# Patient Record
Sex: Female | Born: 1985 | Race: Black or African American | Hispanic: No | Smoking: Never smoker
Health system: Southern US, Community
[De-identification: ages and names within clinical notes are randomized; demographics above are authoritative.]

## PROBLEM LIST (undated history)

## (undated) DIAGNOSIS — D649 Anemia, unspecified: Secondary | ICD-10-CM

## (undated) DIAGNOSIS — I10 Essential (primary) hypertension: Secondary | ICD-10-CM

## (undated) HISTORY — PX: ABDOMINAL HYSTERECTOMY: SHX81

## (undated) HISTORY — PX: DIAGNOSTIC LAPAROSCOPY: SUR761

## (undated) HISTORY — PX: DILATION AND CURETTAGE OF UTERUS: SHX78

---

## 2013-08-23 ENCOUNTER — Emergency Department: Payer: Self-pay | Admitting: Emergency Medicine

## 2014-02-01 ENCOUNTER — Emergency Department: Payer: Self-pay | Admitting: Emergency Medicine

## 2014-02-01 LAB — URINALYSIS, COMPLETE
Bacteria: NONE SEEN
Bilirubin,UR: NEGATIVE
Blood: NEGATIVE
Glucose,UR: NEGATIVE mg/dL (ref 0–75)
Ketone: NEGATIVE
Leukocyte Esterase: NEGATIVE
NITRITE: NEGATIVE
Ph: 7 (ref 4.5–8.0)
Protein: NEGATIVE
RBC, UR: NONE SEEN /HPF (ref 0–5)
Specific Gravity: 1.023 (ref 1.003–1.030)
Squamous Epithelial: 4
WBC UR: 1 /HPF (ref 0–5)

## 2014-02-02 LAB — BASIC METABOLIC PANEL
Anion Gap: 8 (ref 7–16)
BUN: 8 mg/dL (ref 7–18)
CALCIUM: 8.5 mg/dL (ref 8.5–10.1)
CHLORIDE: 105 mmol/L (ref 98–107)
Co2: 25 mmol/L (ref 21–32)
Creatinine: 0.62 mg/dL (ref 0.60–1.30)
EGFR (African American): >60
EGFR (Non-African Amer.): >60
Glucose: 86 mg/dL (ref 65–99)
Osmolality: 273 (ref 275–301)
POTASSIUM: 3.9 mmol/L (ref 3.5–5.1)
Sodium: 138 mmol/L (ref 136–145)

## 2014-02-02 LAB — CBC
HCT: 36.1 % (ref 35.0–47.0)
HGB: 11.6 g/dL — AB (ref 12.0–16.0)
MCH: 27.3 pg (ref 26.0–34.0)
MCHC: 32.2 g/dL (ref 32.0–36.0)
MCV: 85 fL (ref 80–100)
PLATELETS: 225 10*3/uL (ref 150–440)
RBC: 4.27 10*6/uL (ref 3.80–5.20)
RDW: 13.9 % (ref 11.5–14.5)
WBC: 5.5 10*3/uL (ref 3.6–11.0)

## 2014-02-02 LAB — TROPONIN I: Troponin-I: <0.02 ng/mL

## 2014-02-02 LAB — PREGNANCY, URINE: PREGNANCY TEST, URINE: NEGATIVE m[IU]/mL

## 2014-11-18 DIAGNOSIS — I10 Essential (primary) hypertension: Secondary | ICD-10-CM | POA: Insufficient documentation

## 2014-11-18 DIAGNOSIS — N870 Mild cervical dysplasia: Secondary | ICD-10-CM | POA: Insufficient documentation

## 2015-01-20 DIAGNOSIS — D649 Anemia, unspecified: Secondary | ICD-10-CM | POA: Insufficient documentation

## 2016-01-20 ENCOUNTER — Other Ambulatory Visit: Payer: Self-pay

## 2016-02-14 NOTE — H&P (Signed)
Christy Kennedy is a 30 y.o. female here for LAVH   Pt with fibroids, dyspareunia and pelvic pain and menorrhagia . EMBX : negative    Past Medical History:  has a past medical history of Abnormal cytology (11/2013); Allergic state (2014); Chronic anemia, unspecified (01/20/2015); HPV in female; Hypertension; PCOS (polycystic ovarian syndrome); and Uterine fibroid.  Past Surgical History:  has a past surgical history that includes Removal Ovarian Cyst (2004, 2006); removal of fallopian  tube (Bilateral); Colposcopy (12/01/2013); Dilation and curettage of uterus; and Colposcopy. Family History: family history includes No Known Problems in her father and mother. Social History:  reports that she has never smoked. She has never used smokeless tobacco. She reports that she does not drink alcohol or use illicit drugs. OB/GYN History:  OB History    Gravida Para Term Preterm AB TAB SAB Ectopic Multiple Living   3    3   3   0      Allergies: has No Known Allergies. Medications:  Current Outpatient Prescriptions:  .  amLODIPine (NORVASC) 5 MG tablet, Take 1 tablet (5 mg total) by mouth once daily., Disp: 90 tablet, Rfl: 3 .  ferrous sulfate 325 (65 FE) MG tablet, Take 325 mg by mouth daily with breakfast., Disp: , Rfl:  .  HERBAL DRUGS (COLON HERBAL CLEANSER ORAL), Take by mouth. One po every other day, Disp: , Rfl:  .  levonorgestrel-ethinyl estradiol (SEASONALE) 0.15-30 mg-mcg tablet, Take 1 tablet by mouth once daily., Disp: 84 tablet, Rfl: 3  Review of Systems: General:                                          No fatigue or weight loss Eyes:                                                         No vision changes Ears:                                                          No hearing difficulty Respiratory:                No cough or shortness of breath Pulmonary:                                      No asthma or shortness of breath Cardiovascular:                     No chest pain,  palpitations, dyspnea on exertion Gastrointestinal:                    No abdominal bloating, chronic diarrhea, constipations, masses, pain or hematochezia Genitourinary:                                 No hematuria, dysuria, abnormal vaginal discharge,+ pelvic pain,+ Menometrorrhagia,+ dyspareunia,  Lymphatic:                                       No swollen lymph nodes Musculoskeletal:                   No muscle weakness Neurologic:                                      No extremity weakness, syncope, seizure disorder Psychiatric:                                      No history of depression, delusions or suicidal/homicidal ideation    Exam:      Vitals:   02/15/16  BP: 129/73  Pulse: 73    Body mass index is 24.53 kg/(m^2).  WDWN / black female in NAD   Lungs: CTA  CV : RRR without murmur     Abdomen: soft , no mass, normal active bowel sounds,  non-tender, no rebound tenderness Pelvic: tanner stage 5 ,  External genitalia: vulva /labia no lesions Urethra: no prolapse Vagina: normal physiologic d/c, adequate room for vaginal hyst if need be  Cervix: no lesions, no cervical motion tenderness   Uterus: normal size shape and contour, non-tender Adnexa: no mass,  non-tender    Saline infusion sonohysterography: betadine prep to the cervix followed by placement of the HSG catheter into the endometrial canal . Sterile H2O is injected while performing a transvaginal u/s . Findings:3 fibroids , none encroaching on the endometrial canal .largest 33x 29 mm   Impression:   The primary encounter diagnosis was Menorrhagia with irregular cycle. Diagnoses of Dyspareunia, female, Intramural leiomyoma of uterus, and Pelvic pain in female were also pertinent to this visit.    Plan:  I have spoken with the patient regarding treatment options including expectant management, hormonal options, or surgical intervention. After a full discussion the pt elects to proceed with LAVH(  both fallopian tubes previously removed) risks of the procedure discussed . All questions answered .

## 2016-02-15 ENCOUNTER — Encounter
Admission: RE | Admit: 2016-02-15 | Discharge: 2016-02-15 | Disposition: A | Discharge status: Home / Self Care | Payer: BLUE CROSS/BLUE SHIELD | Source: Ambulatory Visit | Attending: Obstetrics and Gynecology | Admitting: Obstetrics and Gynecology

## 2016-02-15 DIAGNOSIS — I10 Essential (primary) hypertension: Secondary | ICD-10-CM | POA: Diagnosis not present

## 2016-02-15 DIAGNOSIS — N941 Unspecified dyspareunia: Secondary | ICD-10-CM | POA: Diagnosis not present

## 2016-02-15 DIAGNOSIS — Z01812 Encounter for preprocedural laboratory examination: Secondary | ICD-10-CM | POA: Insufficient documentation

## 2016-02-15 DIAGNOSIS — N920 Excessive and frequent menstruation with regular cycle: Secondary | ICD-10-CM | POA: Diagnosis not present

## 2016-02-15 DIAGNOSIS — Z01810 Encounter for preprocedural cardiovascular examination: Secondary | ICD-10-CM | POA: Insufficient documentation

## 2016-02-15 DIAGNOSIS — D649 Anemia, unspecified: Secondary | ICD-10-CM | POA: Insufficient documentation

## 2016-02-15 DIAGNOSIS — R102 Pelvic and perineal pain: Secondary | ICD-10-CM | POA: Diagnosis not present

## 2016-02-15 DIAGNOSIS — D259 Leiomyoma of uterus, unspecified: Secondary | ICD-10-CM | POA: Diagnosis present

## 2016-02-15 HISTORY — DX: Anemia, unspecified: D64.9

## 2016-02-15 HISTORY — DX: Essential (primary) hypertension: I10

## 2016-02-15 LAB — TYPE AND SCREEN
ABO/RH(D): O POS
ANTIBODY SCREEN: NEGATIVE

## 2016-02-15 LAB — CBC
HCT: 38.1 % (ref 35.0–47.0)
Hemoglobin: 12.6 g/dL (ref 12.0–16.0)
MCH: 28.2 pg (ref 26.0–34.0)
MCHC: 33 g/dL (ref 32.0–36.0)
MCV: 85.6 fL (ref 80.0–100.0)
PLATELETS: 229 10*3/uL (ref 150–440)
RBC: 4.45 MIL/uL (ref 3.80–5.20)
RDW: 13.5 % (ref 11.5–14.5)
WBC: 3.5 10*3/uL — AB (ref 3.6–11.0)

## 2016-02-15 LAB — BASIC METABOLIC PANEL
Anion gap: 9 (ref 5–15)
BUN: 8 mg/dL (ref 6–20)
CALCIUM: 9.5 mg/dL (ref 8.9–10.3)
CHLORIDE: 99 mmol/L — AB (ref 101–111)
CO2: 27 mmol/L (ref 22–32)
CREATININE: 0.48 mg/dL (ref 0.44–1.00)
GFR calc non Af Amer: >60 mL/min (ref >60)
Glucose, Bld: 73 mg/dL (ref 65–99)
Potassium: 3.9 mmol/L (ref 3.5–5.1)
SODIUM: 135 mmol/L (ref 135–145)

## 2016-02-15 NOTE — Patient Instructions (Signed)
  Your procedure is scheduled on: February 20, 2016 (Monday) Report to Day Surgery. To find out your arrival time please call [phone] between 1PM - 3PM on February 17, 2016 (Friday).  Remember: Instructions that are not followed completely may result in serious medical risk, up to and including death, or upon the discretion of your surgeon and anesthesiologist your surgery may need to be rescheduled.    __x__ 1. Do not eat food or drink liquids after midnight. No gum chewing or hard candies.     _x___ 2. No Alcohol for 24 hours before or after surgery.  _x___ 3. Do Not Smoke For 24 Hours Prior to Your Surgery.   ____ 4. Bring all medications with you on the day of surgery if instructed.    __x__ 5. Notify your doctor if there is any change in your medical condition     (cold, fever, infections).       Do not wear jewelry, make-up, hairpins, clips or nail polish.  Do not wear lotions, powders, or perfumes. You may wear deodorant.  Do not shave 48 hours prior to surgery. Men may shave face and neck.  Do not bring valuables to the hospital.    [facility] is not responsible for any belongings or valuables.               Contacts, dentures or bridgework may not be worn into surgery.  Leave your suitcase in the car. After surgery it may be brought to your room.  For patients admitted to the hospital, discharge time is determined by your                treatment team.   Patients discharged the day of surgery will not be allowed to drive home.   Please read over the following fact sheets that you were given:   Surgical Site Infection Prevention   __x__ Take these medicines the morning of surgery with A SIP OF WATER:    1. Amlodipine  2.   3.   4.  5.  6.  ____ Fleet Enema (as directed)   _x___ Use CHG Soap as directed  ____ Use inhalers on the day of surgery  ____ Stop metformin 2 days prior to surgery    ____ Take 1/2 of usual insulin dose the night before surgery and none  on the morning of surgery.   _x___ Stop Coumadin/Plavix/aspirin on (N/A)  _x__ Stop Anti-inflammatories on (NO NSAIDS) Tylenol ok to take for pain if needed   ____ Stop supplements until after surgery.    ____ Bring C-Pap to the hospital.

## 2016-02-15 NOTE — H&P (Signed)
Pt pain is greater on the left . Therefore I have counseled the pt of a possible left oophorectomy  If significant ovarian involvement .

## 2016-02-16 NOTE — Pre-Procedure Instructions (Signed)
EKG reviewed by Dr. Marcello Moores and stated "no consult needed."

## 2016-02-20 ENCOUNTER — Encounter: Payer: Self-pay | Admitting: *Deleted

## 2016-02-20 ENCOUNTER — Ambulatory Visit: Payer: BLUE CROSS/BLUE SHIELD | Admitting: *Deleted

## 2016-02-20 ENCOUNTER — Encounter
Admission: RE | Disposition: A | Discharge status: Home / Self Care | Payer: Self-pay | Source: Ambulatory Visit | Attending: Obstetrics and Gynecology

## 2016-02-20 ENCOUNTER — Observation Stay
Admission: RE | Admit: 2016-02-20 | Discharge: 2016-02-21 | Disposition: A | Discharge status: Home / Self Care | Payer: BLUE CROSS/BLUE SHIELD | Source: Ambulatory Visit | Attending: Obstetrics and Gynecology | Admitting: Obstetrics and Gynecology

## 2016-02-20 DIAGNOSIS — Z9889 Other specified postprocedural states: Secondary | ICD-10-CM | POA: Insufficient documentation

## 2016-02-20 DIAGNOSIS — N870 Mild cervical dysplasia: Secondary | ICD-10-CM | POA: Diagnosis not present

## 2016-02-20 DIAGNOSIS — K660 Peritoneal adhesions (postprocedural) (postinfection): Secondary | ICD-10-CM | POA: Insufficient documentation

## 2016-02-20 DIAGNOSIS — D649 Anemia, unspecified: Secondary | ICD-10-CM | POA: Diagnosis not present

## 2016-02-20 DIAGNOSIS — R102 Pelvic and perineal pain: Secondary | ICD-10-CM | POA: Diagnosis present

## 2016-02-20 DIAGNOSIS — N8312 Corpus luteum cyst of left ovary: Secondary | ICD-10-CM | POA: Insufficient documentation

## 2016-02-20 DIAGNOSIS — I10 Essential (primary) hypertension: Secondary | ICD-10-CM | POA: Diagnosis not present

## 2016-02-20 DIAGNOSIS — D259 Leiomyoma of uterus, unspecified: Principal | ICD-10-CM | POA: Insufficient documentation

## 2016-02-20 DIAGNOSIS — N920 Excessive and frequent menstruation with regular cycle: Secondary | ICD-10-CM | POA: Insufficient documentation

## 2016-02-20 DIAGNOSIS — Z79899 Other long term (current) drug therapy: Secondary | ICD-10-CM | POA: Diagnosis not present

## 2016-02-20 DIAGNOSIS — N83202 Unspecified ovarian cyst, left side: Secondary | ICD-10-CM | POA: Diagnosis present

## 2016-02-20 DIAGNOSIS — Z793 Long term (current) use of hormonal contraceptives: Secondary | ICD-10-CM | POA: Insufficient documentation

## 2016-02-20 DIAGNOSIS — E282 Polycystic ovarian syndrome: Secondary | ICD-10-CM | POA: Insufficient documentation

## 2016-02-20 DIAGNOSIS — N9489 Other specified conditions associated with female genital organs and menstrual cycle: Secondary | ICD-10-CM | POA: Insufficient documentation

## 2016-02-20 HISTORY — PX: OOPHORECTOMY: SHX6387

## 2016-02-20 HISTORY — PX: LAPAROSCOPIC ASSISTED VAGINAL HYSTERECTOMY: SHX5398

## 2016-02-20 LAB — POCT PREGNANCY, URINE: PREG TEST UR: NEGATIVE

## 2016-02-20 SURGERY — HYSTERECTOMY, VAGINAL, LAPAROSCOPY-ASSISTED
Anesthesia: General

## 2016-02-20 MED ORDER — SUGAMMADEX SODIUM 200 MG/2ML IV SOLN
INTRAVENOUS | Status: DC | PRN
  Administered 2016-02-20: 300 mg via INTRAVENOUS

## 2016-02-20 MED ORDER — MORPHINE SULFATE (PF) 2 MG/ML IV SOLN
1.0000 mg | INTRAVENOUS | Status: DC | PRN
  Administered 2016-02-20: 2 mg via INTRAVENOUS
  Filled 2016-02-20 (×2): qty 1

## 2016-02-20 MED ORDER — LACTATED RINGERS IV SOLN
INTRAVENOUS | Status: DC | PRN
Start: 2016-02-20 — End: 2016-02-20
  Administered 2016-02-20: 10:00:00 via INTRAVENOUS

## 2016-02-20 MED ORDER — CEFOXITIN SODIUM-DEXTROSE 2-2.2 GM-% IV SOLR (PREMIX)
2.0000 g | INTRAVENOUS | Status: AC
  Administered 2016-02-20: 2 g via INTRAVENOUS

## 2016-02-20 MED ORDER — LIDOCAINE-EPINEPHRINE 1 %-1:100000 IJ SOLN
INTRAMUSCULAR | Status: AC
  Filled 2016-02-20: qty 1

## 2016-02-20 MED ORDER — FENTANYL CITRATE (PF) 100 MCG/2ML IJ SOLN
INTRAMUSCULAR | Status: DC | PRN
  Administered 2016-02-20 (×2): 100 ug via INTRAVENOUS

## 2016-02-20 MED ORDER — FAMOTIDINE 20 MG PO TABS
ORAL_TABLET | ORAL | Status: AC
  Administered 2016-02-20: 20 mg via ORAL
  Filled 2016-02-20: qty 1

## 2016-02-20 MED ORDER — PROPOFOL 10 MG/ML IV BOLUS
INTRAVENOUS | Status: DC | PRN
  Administered 2016-02-20: 150 mg via INTRAVENOUS

## 2016-02-20 MED ORDER — BUPIVACAINE HCL (PF) 0.5 % IJ SOLN
INTRAMUSCULAR | Status: AC
  Filled 2016-02-20: qty 30

## 2016-02-20 MED ORDER — LIDOCAINE-EPINEPHRINE 1 %-1:100000 IJ SOLN
INTRAMUSCULAR | Status: DC | PRN
  Administered 2016-02-20: 6 mL

## 2016-02-20 MED ORDER — ONDANSETRON HCL 4 MG/2ML IJ SOLN
4.0000 mg | Freq: Four times a day (QID) | INTRAMUSCULAR | Status: DC | PRN
  Administered 2016-02-20 (×2): 4 mg via INTRAVENOUS
  Filled 2016-02-20 (×2): qty 2

## 2016-02-20 MED ORDER — CEFOXITIN SODIUM-DEXTROSE 2-2.2 GM-% IV SOLR (PREMIX)
INTRAVENOUS | Status: AC
  Filled 2016-02-20: qty 50

## 2016-02-20 MED ORDER — ONDANSETRON HCL 4 MG/2ML IJ SOLN: 4.0000 mg | Freq: Once | INTRAMUSCULAR | Status: DC | PRN

## 2016-02-20 MED ORDER — LACTATED RINGERS IV SOLN
INTRAVENOUS | Status: DC
  Administered 2016-02-20: 09:00:00 via INTRAVENOUS

## 2016-02-20 MED ORDER — FENTANYL CITRATE (PF) 100 MCG/2ML IJ SOLN
INTRAMUSCULAR | Status: AC
  Administered 2016-02-20: 25 ug via INTRAVENOUS
  Filled 2016-02-20: qty 2

## 2016-02-20 MED ORDER — FENTANYL CITRATE (PF) 100 MCG/2ML IJ SOLN
25.0000 ug | INTRAMUSCULAR | Status: DC | PRN
  Administered 2016-02-20 (×4): 25 ug via INTRAVENOUS

## 2016-02-20 MED ORDER — LACTATED RINGERS IV SOLN
INTRAVENOUS | Status: DC
  Administered 2016-02-20 – 2016-02-21 (×2): via INTRAVENOUS

## 2016-02-20 MED ORDER — ONDANSETRON HCL 4 MG PO TABS: 4.0000 mg | ORAL_TABLET | Freq: Four times a day (QID) | ORAL | Status: DC | PRN

## 2016-02-20 MED ORDER — FAMOTIDINE 20 MG PO TABS
20.0000 mg | ORAL_TABLET | Freq: Once | ORAL | Status: AC
  Administered 2016-02-20: 20 mg via ORAL

## 2016-02-20 MED ORDER — SOD CITRATE-CITRIC ACID 500-334 MG/5ML PO SOLN: 30.0000 mL | ORAL | Status: DC

## 2016-02-20 MED ORDER — DEXAMETHASONE SODIUM PHOSPHATE 10 MG/ML IJ SOLN
INTRAMUSCULAR | Status: DC | PRN
  Administered 2016-02-20: 5 mg via INTRAVENOUS

## 2016-02-20 MED ORDER — ESMOLOL HCL 100 MG/10ML IV SOLN
INTRAVENOUS | Status: DC | PRN
Start: 2016-02-20 — End: 2016-02-20
  Administered 2016-02-20: 10 mg via INTRAVENOUS
  Administered 2016-02-20: 20 mg via INTRAVENOUS

## 2016-02-20 MED ORDER — IBUPROFEN 800 MG PO TABS
800.0000 mg | ORAL_TABLET | Freq: Three times a day (TID) | ORAL | Status: DC | PRN
  Administered 2016-02-21: 800 mg via ORAL
  Filled 2016-02-20: qty 1

## 2016-02-20 MED ORDER — BUPIVACAINE HCL 0.5 % IJ SOLN
INTRAMUSCULAR | Status: DC | PRN
  Administered 2016-02-20: 9 mL

## 2016-02-20 MED ORDER — HYDROCODONE-ACETAMINOPHEN 5-325 MG PO TABS
1.0000 | ORAL_TABLET | ORAL | Status: DC | PRN
  Administered 2016-02-20: 2 via ORAL
  Administered 2016-02-20 – 2016-02-21 (×5): 1 via ORAL
  Filled 2016-02-20: qty 1
  Filled 2016-02-20: qty 2
  Filled 2016-02-20 (×4): qty 1

## 2016-02-20 MED ORDER — MIDAZOLAM HCL 2 MG/2ML IJ SOLN
INTRAMUSCULAR | Status: DC | PRN
  Administered 2016-02-20: 2 mg via INTRAVENOUS

## 2016-02-20 MED ORDER — ROCURONIUM BROMIDE 100 MG/10ML IV SOLN
INTRAVENOUS | Status: DC | PRN
  Administered 2016-02-20: 40 mg via INTRAVENOUS
  Administered 2016-02-20 (×3): 10 mg via INTRAVENOUS

## 2016-02-20 MED ORDER — ONDANSETRON HCL 4 MG/2ML IJ SOLN
INTRAMUSCULAR | Status: DC | PRN
  Administered 2016-02-20: 4 mg via INTRAVENOUS

## 2016-02-20 SURGICAL SUPPLY — 44 items
BAG URO DRAIN 2000ML W/SPOUT (MISCELLANEOUS) ×4 IMPLANT
BLADE SURG SZ11 CARB STEEL (BLADE) ×4 IMPLANT
CATH FOLEY 2WAY  5CC 16FR (CATHETERS) ×2
CATH URTH 16FR FL 2W BLN LF (CATHETERS) ×2 IMPLANT
CHLORAPREP W/TINT 26ML (MISCELLANEOUS) ×4 IMPLANT
CLOSURE WOUND 1/2 X4 (GAUZE/BANDAGES/DRESSINGS)
DRAPE SURG 17X11 SM STRL (DRAPES) ×4 IMPLANT
DRSG TEGADERM 2X2.25 PEDS (GAUZE/BANDAGES/DRESSINGS) IMPLANT
ELECT REM PT RETURN 9FT ADLT (ELECTROSURGICAL) ×4
ELECTRODE REM PT RTRN 9FT ADLT (ELECTROSURGICAL) ×2 IMPLANT
FILTER LAP SMOKE EVAC STRL (MISCELLANEOUS) IMPLANT
GLOVE BIO SURGEON STRL SZ8 (GLOVE) ×16 IMPLANT
GOWN STRL REUS W/ TWL LRG LVL3 (GOWN DISPOSABLE) ×6 IMPLANT
GOWN STRL REUS W/ TWL XL LVL3 (GOWN DISPOSABLE) ×6 IMPLANT
GOWN STRL REUS W/TWL LRG LVL3 (GOWN DISPOSABLE) ×6
GOWN STRL REUS W/TWL XL LVL3 (GOWN DISPOSABLE) ×6
HANDLE YANKAUER SUCT BULB TIP (MISCELLANEOUS) ×4 IMPLANT
IRRIGATION STRYKERFLOW (MISCELLANEOUS) ×2 IMPLANT
IRRIGATOR STRYKERFLOW (MISCELLANEOUS) ×4
KIT PINK PAD W/HEAD ARE REST (MISCELLANEOUS) ×4
KIT PINK PAD W/HEAD ARM REST (MISCELLANEOUS) ×2 IMPLANT
KIT RM TURNOVER CYSTO AR (KITS) ×4 IMPLANT
LABEL OR SOLS (LABEL) IMPLANT
LIQUID BAND (GAUZE/BANDAGES/DRESSINGS) ×4 IMPLANT
PACK BASIN MINOR ARMC (MISCELLANEOUS) IMPLANT
PACK GYN LAPAROSCOPIC (MISCELLANEOUS) ×4 IMPLANT
PAD OB MATERNITY 4.3X12.25 (PERSONAL CARE ITEMS) ×4 IMPLANT
SCISSORS METZENBAUM CVD 33 (INSTRUMENTS) IMPLANT
SHEARS HARMONIC ACE PLUS 36CM (ENDOMECHANICALS) ×4 IMPLANT
SLEEVE ENDOPATH XCEL 5M (ENDOMECHANICALS) ×4 IMPLANT
SPONGE XRAY 4X4 16PLY STRL (MISCELLANEOUS) IMPLANT
STRIP CLOSURE SKIN 1/2X4 (GAUZE/BANDAGES/DRESSINGS) IMPLANT
SUT VIC AB 0 CT1 27 (SUTURE) ×4
SUT VIC AB 0 CT1 27XCR 8 STRN (SUTURE) ×4 IMPLANT
SUT VIC AB 0 CT1 36 (SUTURE) ×4 IMPLANT
SUT VIC AB 0 CT2 27 (SUTURE) ×4 IMPLANT
SUT VIC AB 2-0 SH 27 (SUTURE) ×2
SUT VIC AB 2-0 SH 27XBRD (SUTURE) ×2 IMPLANT
SUT VIC AB 2-0 UR6 27 (SUTURE) ×4 IMPLANT
SUT VIC AB 4-0 FS2 27 (SUTURE) ×4 IMPLANT
SYRINGE 10CC LL (SYRINGE) ×4 IMPLANT
TROCAR ENDO BLADELESS 11MM (ENDOMECHANICALS) ×4 IMPLANT
TROCAR XCEL NON-BLD 5MMX100MML (ENDOMECHANICALS) ×4 IMPLANT
TUBING INSUFFLATOR HEATED (MISCELLANEOUS) ×4 IMPLANT

## 2016-02-20 NOTE — Anesthesia Preprocedure Evaluation (Signed)
Anesthesia Evaluation  Patient identified by MRN, date of birth, ID band Patient awake    Reviewed: Allergy & Precautions, NPO status , Patient's Chart, lab work & pertinent test results  History of Anesthesia Complications Negative for: history of anesthetic complications  Airway Mallampati: II       Dental   Pulmonary neg pulmonary ROS,           Cardiovascular hypertension, Pt. on medications      Neuro/Psych negative neurological ROS     GI/Hepatic negative GI ROS, Neg liver ROS,   Endo/Other  negative endocrine ROS  Renal/GU negative Renal ROS     Musculoskeletal   Abdominal   Peds  Hematology  (+) anemia ,   Anesthesia Other Findings   Reproductive/Obstetrics                             Anesthesia Physical Anesthesia Plan  ASA: II  Anesthesia Plan: General   Post-op Pain Management:    Induction: Intravenous  Airway Management Planned:   Additional Equipment:   Intra-op Plan:   Post-operative Plan:   Informed Consent: I have reviewed the patients History and Physical, chart, labs and discussed the procedure including the risks, benefits and alternatives for the proposed anesthesia with the patient or authorized representative who has indicated his/her understanding and acceptance.     Plan Discussed with:   Anesthesia Plan Comments:         Anesthesia Quick Evaluation

## 2016-02-20 NOTE — Anesthesia Procedure Notes (Signed)
Date/Time: 02/20/2016 9:53 AM Performed by: Jennette Bill Pre-anesthesia Checklist: Patient identified, Emergency Drugs available, Suction available, Patient being monitored and Timeout performed Patient Re-evaluated:Patient Re-evaluated prior to inductionOxygen Delivery Method: Circle system utilized Preoxygenation: Pre-oxygenation with 100% oxygen Intubation Type: IV induction Laryngoscope Size: Mac and 3 Grade View: Grade II Tube type: Oral Tube size: 7.0 mm Number of attempts: 1 Placement Confirmation: ETT inserted through vocal cords under direct vision,  positive ETCO2 and breath sounds checked- equal and bilateral Secured at: 22 cm Dental Injury: Teeth and Oropharynx as per pre-operative assessment

## 2016-02-20 NOTE — Op Note (Deleted)
NAME:  Christy Kennedy, Christy Kennedy NO.:  [id number]  MEDICAL RECORD NO.:  [id number]  LOCATION:                               FACILITY:  [facility]  PHYSICIAN:  Laverta Baltimore, MDDATE OF BIRTH:  06/03/1986  DATE OF PROCEDURE: DATE OF DISCHARGE:  02/21/2016                              OPERATIVE REPORT   PREOPERATIVE DIAGNOSIS: 1. Menorrhagia. 2. Fibroid uterus. 3. Pelvic pain, left greater than right.  POSTOPERATIVE DIAGNOSIS: 1. Menorrhagia. 2. Fibroid uterus. 3. Complex left ovary. 4. Pelvic adhesions.  Procedure:  1. Laparoscopic assisted vaginal hysterectomy  2. Left oophorectomy  3. Lysis of adhesions  4. Peritoneal biopsy  ANESTHESIA:  General endotracheal anesthesia.  SURGEON:  Laverta Baltimore, MD  FIRST ASSISTANT:  Leafy Ro.  INDICATIONS:  A 30 year old, gravida 3, para 0 patient with several-year history of menorrhagia, known fibroid uterus, dyspareunia, left-greater- than-right pelvic pain.  The patient is status post 2 prior surgeries with removal of both fallopian tubes.  DESCRIPTION OF PROCEDURE:  After adequate general endotracheal anesthesia, the patient was placed in dorsal supine position, legs in the [place] stirrups.  Abdomen, perineum, and vagina were prepped and draped in normal sterile fashion.  The patient did receive 2 g IV cefoxitin prior to commencement of the case.  Time-out was performed.  A Foley catheter was placed into the bladder yielding 150 mL clear urine. A speculum was placed in the vagina and the anterior cervix was grasped with a single-tooth tenaculum.  Gloves were changed and attention was directed to the abdomen.  An infraumbilical incision was made after injecting with 0.5% Marcaine.  The laparoscope was advanced into the abdominal cavity under direct visualization with the Optiview cannula. The patient's abdomen was insufflated.  The patient was placed in Trendelenburg.  At the left lower port site, 5 mm port was  advanced 3 cm medial to the left anterior iliac spine.  Under direct visualization, a 5 mm trocar was advanced into the abdominal cavity.  Similar procedure was repeated on the right, again 3 cm medial to the right anterior iliac spine, a 5 mm port advanced into the abdominal cavity.  Attention was then directed to the patient's pelvis which revealed a 5 x 3 cm solid- appearing left complex ovarian mass.  The posterior cul-de-sac had demonstrated multiple small vesicles possibly consistent with endometriosis.  The upper abdomen showed flimsy omental adhesions to the anterior abdominal wall.  The Harmonic Scalpel was then brought up to the operative field, and given the size of the left ovary and the complexity, it was decided that the patient will undergo a left oophorectomy.  After visualizing the ureters bilaterally, the left infundibulopelvic ligament was cauterized with the Kleppinger and then dissected free with the Harmonic Scalpel.  Broad ligament was opened and ultimately the bladder flap was created with the Harmonic Scalpel.  The left uterine artery was skeletonized, bovied, and then transected with the Harmonic Scalpel.  Good hemostasis noted.  On the right side, the right round ligament was opened followed by clamping and opening the right uterine ovarian ligament.  Serial bites were taken in the right broad ligaments and bladder flap was communicated from the left to  the right uterine artery.  The artery was skeletonized, bovied, and transected.  Good hemostasis was noted.  The adhesions to the anterior abdominal wall were then taken down with the Harmonic Scalpel under direct visualization.  Given the posterior cul-de-sac anterior to the right uterosacral ligament had multiple vesicles consistent with possible endometriosis.  This tissue was grasped and dissected free and sent to Pathology for permanent identification.  Surgeon then moved to the vaginal area, legs were  brought up, and a weighted speculum was placed in the vagina and the anterior and posterior cervix was grasped with 2 thyroid tenacula.  The cervix was circumferentially injected without difficulty.  A direct posterior colpotomy incision was made upon entry into the posterior cul-de-sac.  Some old serosanguineous fluid was released.  The uterosacral ligaments were then bilaterally clamped, transected, suture ligated, and identified and held for later identification.  Anterior cervix was circumferentially incised with the Bovie.  Cardinal ligaments were bilaterally clamped, transected, suture ligated with 0 Vicryl suture.  The uterine arteries were also clamped, transected, suture ligated with 0 Vicryl suture.  The anterior cul-de- sac was then entered without difficulty and ultimately the uterus and right ovary were removed vaginally.  The pedicles appeared hemostatic and the vaginal cuff was then closed with a 0 Vicryl suture in a running, nonlocking fashion.  Good approximation of edges, good hemostasis was noted.  Gloves were changed and attention was then directed towards the abdominal cavity where the laparoscope was advanced again into the abdominal cavity and tissues appeared hemostatic. Ureters appeared normal bilaterally.  The patient's abdomen was then deflated and the infraumbilical incision was closed with a fascial layer of 2-0 Vicryl suture and all 3 skin incisions were closed with interrupted 4-0 Vicryl suture.  A 2 x 2 dressing and Tegaderm placed on the 3 port sites.  There were no complications.  ESTIMATED BLOOD LOSS:  50 mL.  URINE OUTPUT:  1000 mL.  INTRAOPERATIVE FLUIDS:  1200 mL.  The patient was taken to recovery room in good condition.          ______________________________ Laverta Baltimore, MD     TS/MEDQ  D:  02/20/2016  T:  02/20/2016  Job:  [id number]

## 2016-02-20 NOTE — Brief Op Note (Signed)
02/20/2016  11:54 AM  PATIENT:  Christy Kennedy  30 y.o. female  PRE-OPERATIVE DIAGNOSIS:  Menorrhagia, dyspareunia, pelvic pain, fibroids  POST-OPERATIVE DIAGNOSIS:  Menorrhagia, dyspareunia, pelvic pain, fibroids, complex 5cm left ovarian cyst . Abdominal adhesions  PROCEDURE:  Procedure(s): LAPAROSCOPIC ASSISTED VAGINAL HYSTERECTOMY (N/A) OOPHORECTOMY (Left), lysis of adhesions , peritoneal biopsy   SURGEON:  Surgeon(s) and Role:    Boykin Nearing, MD - Primary    * Benjaman Kindler, MD - Assisting  PHYSICIAN ASSISTANT:   ASSISTANTS:  doman, scrub tech   ANESTHESIA:   general  EBL:  Total I/O In: -  Out: [id number] [Urine:1000; Blood:50]  IOF 1200 cc  BLOOD ADMINISTERED:none  DRAINS: Urinary Catheter (Foley)   LOCAL MEDICATIONS USED:  MARCAINE   , 7 cc  SPECIMEN:  Source of Specimen:  cervix , uterus , left ovarian , peritoneal biopsy  DISPOSITION OF SPECIMEN:  PATHOLOGY  COUNTS:  YES  TOURNIQUET:  * No tourniquets in log *  DICTATION: .Other Dictation: Dictation Number verbal   PLAN OF CARE: Admit for overnight observation  PATIENT DISPOSITION:  PACU - hemodynamically stable.   Delay start of Pharmacological VTE agent (>24hrs) due to surgical blood loss or risk of bleeding: not applicable

## 2016-02-20 NOTE — Progress Notes (Signed)
Patient ID: Christy Kennedy, female   DOB: 1985-12-31, 31 y.o.   MRN: [id number] DOS LAVH + left Oophorectomy  Pain ok  O: VSS  UO good  Perineum : no blood  A: stable  P: Anticipate d/c in am

## 2016-02-20 NOTE — Anesthesia Postprocedure Evaluation (Signed)
Anesthesia Post Note  Patient: Christy Kennedy  Procedure(s) Performed: Procedure(s) (LRB): LAPAROSCOPIC ASSISTED VAGINAL HYSTERECTOMY, LEFT OOPHERECTOMY, LYSIS OF ADHESIONS (N/A) OOPHORECTOMY (Left)  Patient location during evaluation: PACU Anesthesia Type: General Level of consciousness: awake and alert Pain management: pain level controlled Vital Signs Assessment: post-procedure vital signs reviewed and stable Respiratory status: spontaneous breathing and respiratory function stable Cardiovascular status: stable Anesthetic complications: no    Last Vitals:  Filed Vitals:   02/20/16 1315 02/20/16 1418  BP: 112/60 112/69  Pulse: 73 69  Temp: 36.3 C 36.4 C  Resp: 20 20    Last Pain:  Filed Vitals:   02/20/16 1453  PainSc: 6                  Maryrose Colvin K

## 2016-02-20 NOTE — Transfer of Care (Signed)
Immediate Anesthesia Transfer of Care Note  Patient: Christy Kennedy  Procedure(s) Performed: Procedure(s): LAPAROSCOPIC ASSISTED VAGINAL HYSTERECTOMY (N/A) OOPHORECTOMY (Left)  Patient Location: PACU  Anesthesia Type:General  Level of Consciousness: awake, alert  and oriented  Airway & Oxygen Therapy: Patient Spontanous Breathing and Patient connected to face mask oxygen  Post-op Assessment: Report given to RN and Post -op Vital signs reviewed and stable  Post vital signs: Reviewed and stable  Last Vitals:  Filed Vitals:   02/20/16 0849  BP: 155/86  Pulse: 74  Temp: 37.1 C  Resp: 16    Last Pain: There were no vitals filed for this visit.       Complications: No apparent anesthesia complications

## 2016-02-20 NOTE — Op Note (Signed)
NAME:  YOANA, LARACUENTE NO.:  [id number]  MEDICAL RECORD NO.:  [id number]  LOCATION:                               FACILITY:  [facility]  PHYSICIAN:  Laverta Baltimore, MDDATE OF BIRTH:  1986/04/30  DATE OF PROCEDURE: DATE OF DISCHARGE:  02/21/2016                              OPERATIVE REPORT   PREOPERATIVE DIAGNOSIS: 1. Menorrhagia. 2. Fibroid uterus. 3. Pelvic pain, left greater than right.  POSTOPERATIVE DIAGNOSIS: 1. Menorrhagia. 2. Fibroid uterus. 3. Complex left ovary. 4. Pelvic adhesions.  Procedure:  1. Laparoscopic assisted vaginal hysterectomy  2. Left oophorectomy  3. Lysis of adhesions  4. Peritoneal biopsy  ANESTHESIA:  General endotracheal anesthesia.  SURGEON:  Laverta Baltimore, MD  FIRST ASSISTANT:  Leafy Ro.  INDICATIONS:  A 30 year old, gravida 3, para 0 patient with several-year history of menorrhagia, known fibroid uterus, dyspareunia, left-greater- than-right pelvic pain.  The patient is status post 2 prior surgeries with removal of both fallopian tubes.  DESCRIPTION OF PROCEDURE:  After adequate general endotracheal anesthesia, the patient was placed in dorsal supine position, legs in the [place] stirrups.  Abdomen, perineum, and vagina were prepped and draped in normal sterile fashion.  The patient did receive 2 g IV cefoxitin prior to commencement of the case.  Time-out was performed.  A Foley catheter was placed into the bladder yielding 150 mL clear urine. A speculum was placed in the vagina and the anterior cervix was grasped with a single-tooth tenaculum.  Gloves were changed and attention was directed to the abdomen.  An infraumbilical incision was made after injecting with 0.5% Marcaine.  The laparoscope was advanced into the abdominal cavity under direct visualization with the Optiview cannula. The patient's abdomen was insufflated.  The patient was placed in Trendelenburg.  At the left lower port site, 5 mm port was  advanced 3 cm medial to the left anterior iliac spine.  Under direct visualization, a 5 mm trocar was advanced into the abdominal cavity.  Similar procedure was repeated on the right, again 3 cm medial to the right anterior iliac spine, a 5 mm port advanced into the abdominal cavity.  Attention was then directed to the patient's pelvis which revealed a 5 x 3 cm solid- appearing left complex ovarian mass.  The posterior cul-de-sac had demonstrated multiple small vesicles possibly consistent with endometriosis.  The upper abdomen showed flimsy omental adhesions to the anterior abdominal wall.  The Harmonic Scalpel was then brought up to the operative field, and given the size of the left ovary and the complexity, it was decided that the patient will undergo a left oophorectomy.  After visualizing the ureters bilaterally, the left infundibulopelvic ligament was cauterized with the Kleppinger and then dissected free with the Harmonic Scalpel.  Broad ligament was opened and ultimately the bladder flap was created with the Harmonic Scalpel.  The left uterine artery was skeletonized, bovied, and then transected with the Harmonic Scalpel.  Good hemostasis noted.  On the right side, the right round ligament was opened followed by clamping and opening the right uterine ovarian ligament.  Serial bites were taken in the right broad ligaments and bladder flap was communicated from the left to  the right uterine artery.  The artery was skeletonized, bovied, and transected.  Good hemostasis was noted.  The adhesions to the anterior abdominal wall were then taken down with the Harmonic Scalpel under direct visualization.  Given the posterior cul-de-sac anterior to the right uterosacral ligament had multiple vesicles consistent with possible endometriosis.  This tissue was grasped and dissected free and sent to Pathology for permanent identification.  Surgeon then moved to the vaginal area, legs were  brought up, and a weighted speculum was placed in the vagina and the anterior and posterior cervix was grasped with 2 thyroid tenacula.  The cervix was circumferentially injected without difficulty.  A direct posterior colpotomy incision was made upon entry into the posterior cul-de-sac.  Some old serosanguineous fluid was released.  The uterosacral ligaments were then bilaterally clamped, transected, suture ligated, and identified and held for later identification.  Anterior cervix was circumferentially incised with the Bovie.  Cardinal ligaments were bilaterally clamped, transected, suture ligated with 0 Vicryl suture.  The uterine arteries were also clamped, transected, suture ligated with 0 Vicryl suture.  The anterior cul-de- sac was then entered without difficulty and ultimately the uterus and right ovary were removed vaginally.  The pedicles appeared hemostatic and the vaginal cuff was then closed with a 0 Vicryl suture in a running, nonlocking fashion.  Good approximation of edges, good hemostasis was noted.  Gloves were changed and attention was then directed towards the abdominal cavity where the laparoscope was advanced again into the abdominal cavity and tissues appeared hemostatic. Ureters appeared normal bilaterally.  The patient's abdomen was then deflated and the infraumbilical incision was closed with a fascial layer of 2-0 Vicryl suture and all 3 skin incisions were closed with interrupted 4-0 Vicryl suture.  A 2 x 2 dressing and Tegaderm placed on the 3 port sites.  There were no complications.  ESTIMATED BLOOD LOSS:  50 mL.  URINE OUTPUT:  1000 mL.  INTRAOPERATIVE FLUIDS:  1200 mL.  The patient was taken to recovery room in good condition.          ______________________________ Laverta Baltimore, MD     TS/MEDQ  D:  02/20/2016  T:  02/20/2016  Job:  [id number]

## 2016-02-20 NOTE — Progress Notes (Signed)
Ready for LAVH  Possible Left salpingectomy. LAbs reviewed . Hcg : neg .  All questions answered . NPO . Proceed with surgery

## 2016-02-21 ENCOUNTER — Encounter: Payer: Self-pay | Admitting: Obstetrics and Gynecology

## 2016-02-21 DIAGNOSIS — D259 Leiomyoma of uterus, unspecified: Secondary | ICD-10-CM | POA: Diagnosis not present

## 2016-02-21 LAB — CBC
HEMATOCRIT: 35.6 % (ref 35.0–47.0)
HEMOGLOBIN: 11.8 g/dL — AB (ref 12.0–16.0)
MCH: 28.3 pg (ref 26.0–34.0)
MCHC: 33.1 g/dL (ref 32.0–36.0)
MCV: 85.5 fL (ref 80.0–100.0)
Platelets: 218 10*3/uL (ref 150–440)
RBC: 4.16 MIL/uL (ref 3.80–5.20)
RDW: 12.9 % (ref 11.5–14.5)
WBC: 10 10*3/uL (ref 3.6–11.0)

## 2016-02-21 LAB — BASIC METABOLIC PANEL
ANION GAP: 7 (ref 5–15)
BUN: 6 mg/dL (ref 6–20)
CALCIUM: 8.8 mg/dL — AB (ref 8.9–10.3)
CHLORIDE: 104 mmol/L (ref 101–111)
CO2: 28 mmol/L (ref 22–32)
Creatinine, Ser: 0.54 mg/dL (ref 0.44–1.00)
Glucose, Bld: 97 mg/dL (ref 65–99)
POTASSIUM: 3.6 mmol/L (ref 3.5–5.1)
Sodium: 139 mmol/L (ref 135–145)

## 2016-02-21 MED ORDER — HYDROCODONE-ACETAMINOPHEN 5-325 MG PO TABS: 1.0000 | ORAL_TABLET | ORAL | Status: DC | PRN

## 2016-02-21 MED ORDER — DOCUSATE SODIUM 100 MG PO CAPS: 100.0000 mg | ORAL_CAPSULE | Freq: Two times a day (BID) | ORAL | Status: DC

## 2016-02-21 MED ORDER — IBUPROFEN 800 MG PO TABS: 800.0000 mg | ORAL_TABLET | Freq: Three times a day (TID) | ORAL | Status: DC | PRN

## 2016-02-21 MED ORDER — ONDANSETRON HCL 4 MG PO TABS: 4.0000 mg | ORAL_TABLET | Freq: Four times a day (QID) | ORAL | Status: DC | PRN

## 2016-02-21 NOTE — Progress Notes (Signed)
Discharge instructions reviewed with pt.  Rx given for home use.

## 2016-02-21 NOTE — Progress Notes (Signed)
Discharged to home to car via wheelchair

## 2016-02-21 NOTE — Discharge Summary (Signed)
Physician Discharge Summary  Patient ID: Christy Kennedy MRN: [id number] DOB/AGE: [birth year] 30 y.o.  Admit date: 02/20/2016 Discharge date: 02/21/2016  Admission Diagnoses:menorrhagia, pelvic pain , left ovarian cyst  Discharge Diagnoses: same Active Problems:   Post-operative state   Discharged Condition: good  Hospital Course: uncomplicated LAVH and left oophorectomy , LOA  And peritoneal biopsy   Consults: None  Significant Diagnostic Studies: labs: hct 35 , BUN/ CR nl   Treatments: surgery: as above  Discharge Exam: Blood pressure 131/74, pulse 77, temperature 98.7 F (37.1 C), temperature source Oral, resp. rate 18, height 5' 5.5" (1.664 m), weight 154 lb (69.854 kg), SpO2 100 %. General appearance: alert and cooperative Resp: clear to auscultation bilaterally Cardio: regular rate and rhythm, S1, S2 normal, no murmur, click, rub or gallop GI: soft, non-tender; bowel sounds normal; no masses,  no organomegaly Incisions dry  Disposition: Final discharge disposition not confirmed  Discharge Instructions    Call MD for:  difficulty breathing, headache or visual disturbances    Complete by:  As directed      Call MD for:  extreme fatigue    Complete by:  As directed      Call MD for:  hives    Complete by:  As directed      Call MD for:  persistant dizziness or light-headedness    Complete by:  As directed      Call MD for:  persistant nausea and vomiting    Complete by:  As directed      Call MD for:  redness, tenderness, or signs of infection (pain, swelling, redness, odor or green/yellow discharge around incision site)    Complete by:  As directed      Call MD for:  severe uncontrolled pain    Complete by:  As directed      Call MD for:  temperature >100.4    Complete by:  As directed      Diet - low sodium heart healthy    Complete by:  As directed      Increase activity slowly    Complete by:  As directed      Sexual Activity Restrictions    Complete by:  As  directed   Pelvic rest 30 days            Medication List    TAKE these medications        amLODipine 5 MG tablet  Commonly known as:  NORVASC  Take 5 mg by mouth daily.     bisacodyl 5 MG EC tablet  Commonly known as:  DULCOLAX  Take 5 mg by mouth daily as needed for moderate constipation.     docusate sodium 100 MG capsule  Commonly known as:  COLACE  Take 1 capsule (100 mg total) by mouth 2 (two) times daily.     ferrous sulfate 325 (65 FE) MG tablet  Take 325 mg by mouth every other day.     HYDROcodone-acetaminophen 5-325 MG tablet  Commonly known as:  NORCO/VICODIN  Take 1-2 tablets by mouth every 4 (four) hours as needed for moderate pain.     ibuprofen 800 MG tablet  Commonly known as:  ADVIL,MOTRIN  Take 1 tablet (800 mg total) by mouth every 8 (eight) hours as needed (mild pain).     ondansetron 4 MG tablet  Commonly known as:  ZOFRAN  Take 1 tablet (4 mg total) by mouth every 6 (six) hours as needed for nausea.  Follow-up Information    Follow up with Maika Mcelveen, MD In 2 weeks.   Specialty:  Obstetrics and Gynecology   Contact information:   [address] [place] [address] [phone]       Signed: Laverta Baltimore 02/21/2016, 9:16 AM

## 2016-02-22 LAB — SURGICAL PATHOLOGY

## 2016-05-30 DIAGNOSIS — R3121 Asymptomatic microscopic hematuria: Secondary | ICD-10-CM | POA: Insufficient documentation

## 2017-11-24 ENCOUNTER — Emergency Department
Admission: EM | Admit: 2017-11-24 | Discharge: 2017-11-24 | Disposition: A | Discharge status: Home / Self Care | Payer: BLUE CROSS/BLUE SHIELD | Attending: Emergency Medicine | Admitting: Emergency Medicine

## 2017-11-24 ENCOUNTER — Emergency Department: Payer: BLUE CROSS/BLUE SHIELD

## 2017-11-24 DIAGNOSIS — I10 Essential (primary) hypertension: Secondary | ICD-10-CM | POA: Insufficient documentation

## 2017-11-24 DIAGNOSIS — Z79899 Other long term (current) drug therapy: Secondary | ICD-10-CM | POA: Diagnosis not present

## 2017-11-24 DIAGNOSIS — R102 Pelvic and perineal pain: Secondary | ICD-10-CM | POA: Insufficient documentation

## 2017-11-24 DIAGNOSIS — N760 Acute vaginitis: Secondary | ICD-10-CM | POA: Diagnosis not present

## 2017-11-24 DIAGNOSIS — B9689 Other specified bacterial agents as the cause of diseases classified elsewhere: Secondary | ICD-10-CM

## 2017-11-24 DIAGNOSIS — N938 Other specified abnormal uterine and vaginal bleeding: Secondary | ICD-10-CM | POA: Diagnosis present

## 2017-11-24 LAB — BASIC METABOLIC PANEL
Anion gap: 8 (ref 5–15)
BUN: 10 mg/dL (ref 6–20)
CO2: 24 mmol/L (ref 22–32)
Calcium: 8.8 mg/dL — ABNORMAL LOW (ref 8.9–10.3)
Chloride: 105 mmol/L (ref 101–111)
Creatinine, Ser: 0.55 mg/dL (ref 0.44–1.00)
GFR calc Af Amer: >60 mL/min (ref >60)
GFR calc non Af Amer: >60 mL/min (ref >60)
Glucose, Bld: 102 mg/dL — ABNORMAL HIGH (ref 65–99)
Potassium: 3.5 mmol/L (ref 3.5–5.1)
Sodium: 137 mmol/L (ref 135–145)

## 2017-11-24 LAB — WET PREP, GENITAL
Sperm: NONE SEEN
Trich, Wet Prep: NONE SEEN
Yeast Wet Prep HPF POC: NONE SEEN

## 2017-11-24 LAB — CHLAMYDIA/NGC RT PCR (ARMC ONLY)
Chlamydia Tr: NOT DETECTED
N gonorrhoeae: NOT DETECTED

## 2017-11-24 LAB — URINALYSIS, COMPLETE (UACMP) WITH MICROSCOPIC
BACTERIA UA: NONE SEEN
BILIRUBIN URINE: NEGATIVE
Glucose, UA: NEGATIVE mg/dL
Ketones, ur: NEGATIVE mg/dL
LEUKOCYTES UA: NEGATIVE
NITRITE: NEGATIVE
PH: 6 (ref 5.0–8.0)
Protein, ur: NEGATIVE mg/dL
Specific Gravity, Urine: 1.015 (ref 1.005–1.030)

## 2017-11-24 LAB — CBC
HCT: 39.4 % (ref 35.0–47.0)
Hemoglobin: 13 g/dL (ref 12.0–16.0)
MCH: 27.9 pg (ref 26.0–34.0)
MCHC: 32.9 g/dL (ref 32.0–36.0)
MCV: 84.8 fL (ref 80.0–100.0)
Platelets: 259 10*3/uL (ref 150–440)
RBC: 4.65 MIL/uL (ref 3.80–5.20)
RDW: 13.1 % (ref 11.5–14.5)
WBC: 4.7 10*3/uL (ref 3.6–11.0)

## 2017-11-24 MED ORDER — METRONIDAZOLE 500 MG PO TABS: 500.0000 mg | ORAL_TABLET | Freq: Two times a day (BID) | ORAL | 0 refills | Status: DC

## 2017-11-24 NOTE — ED Provider Notes (Signed)
[facility] Emergency Department Provider Note  ____________________________________________   None    (approximate)  I have reviewed the triage vital signs and the nursing notes.   HISTORY  Chief Complaint Vaginal Bleeding and Dysuria   HPI Christy Kennedy is a 32 y.o. female is here with concerns of severe vaginal itching along with postcoital bleeding intermittently for the last 2 weeks.  Patient has not seen anyone prior to today.  Patient states she is quite anxious as she had a hysterectomy in 2017 and does not understand where the bleeding is coming from.  She states that she is aware that she has a history of cyst on her right ovary.  She denies any vaginal discharge.  She is unaware of any fever, chills, nausea or vomiting.   Past Medical History:  Diagnosis Date  . Anemia    iron deficiency anemia  . Hypertension     Patient Active Problem List   Diagnosis Date Noted  . Post-operative state 02/20/2016    Past Surgical History:  Procedure Laterality Date  . ABDOMINAL HYSTERECTOMY    . DIAGNOSTIC LAPAROSCOPY    . DILATION AND CURETTAGE OF UTERUS    . LAPAROSCOPIC ASSISTED VAGINAL HYSTERECTOMY N/A 02/20/2016   Procedure: LAPAROSCOPIC ASSISTED VAGINAL HYSTERECTOMY, LEFT OOPHERECTOMY, LYSIS OF ADHESIONS;  Surgeon: Boykin Nearing, MD;  Location: [facility] ORS;  Service: Gynecology;  Laterality: N/A;  . OOPHORECTOMY Left 02/20/2016   Procedure: OOPHORECTOMY;  Surgeon: Boykin Nearing, MD;  Location: [facility] ORS;  Service: Gynecology;  Laterality: Left;    Prior to Admission medications   Medication Sig Start Date End Date Taking? Authorizing Provider  amLODipine (NORVASC) 5 MG tablet Take 5 mg by mouth daily.    [provider]  bisacodyl (DULCOLAX) 5 MG EC tablet Take 5 mg by mouth daily as needed for moderate constipation.    [provider]  docusate sodium (COLACE) 100 MG capsule Take 1 capsule (100 mg total) by mouth 2  (two) times daily. 02/21/16   Schermerhorn, Gwen Her, MD  ferrous sulfate 325 (65 FE) MG tablet Take 325 mg by mouth every other day.    [provider]  ibuprofen (ADVIL,MOTRIN) 800 MG tablet Take 1 tablet (800 mg total) by mouth every 8 (eight) hours as needed (mild pain). 02/21/16   Schermerhorn, Gwen Her, MD  metroNIDAZOLE (FLAGYL) 500 MG tablet Take 1 tablet (500 mg total) by mouth 2 (two) times daily. 11/24/17   Johnn Hai, PA-C    Allergies Patient has no known allergies.  No family history on file.  Social History Social History   Tobacco Use  . Smoking status: Never Smoker  . Smokeless tobacco: Never Used  Substance Use Topics  . Alcohol use: Yes    Comment: occ  . Drug use: No    Review of Systems Constitutional: No fever/chills Cardiovascular: Denies chest pain. Respiratory: Denies shortness of breath. Gastrointestinal: No abdominal pain.  No nausea, no vomiting. Genitourinary: Negative for dysuria.  Positive for vaginal itching.  Positive for postcoital bleeding. Musculoskeletal: Negative for back pain. Skin: Negative for rash. Neurological: Negative for headaches ___________________________________________   PHYSICAL EXAM:  VITAL SIGNS: ED Triage Vitals  Enc Vitals Group     BP 11/24/17 0542 (!) 153/79     Pulse Rate 11/24/17 0542 66     Resp 11/24/17 0542 18     Temp 11/24/17 0542 98.8 F (37.1 C)     Temp Source 11/24/17 0542 Oral  SpO2 11/24/17 0542 99 %     Weight 11/24/17 0548 168 lb (76.2 kg)     Height --      Head Circumference --      Peak Flow --      Pain Score --      Pain Loc --      Pain Edu? --      Excl. in [place]? --    Constitutional: Alert and oriented. Well appearing and in no acute distress. Eyes: Conjunctivae are normal.  Head: Atraumatic. Neck: No stridor.   Cardiovascular: Normal rate, regular rhythm. Grossly normal heart sounds.  Good peripheral circulation. Respiratory: Normal respiratory effort.  No  retractions. Lungs CTAB. Gastrointestinal: Soft and nontender. No distention.  No CVA tenderness. Genitourinary: External genitalia is unremarkable.  There is white secretions noted along the vaginal vault.  There is no adnexal masses or tenderness appreciated.  No lesions were seen.  Cultures and wet prep were obtained. Musculoskeletal: Moves upper and lower extremities without any difficulty.  Normal gait was noted. Neurologic:  Normal speech and language. No gross focal neurologic deficits are appreciated.  Skin:  Skin is warm, dry and intact. No rash noted. Psychiatric: Mood and affect are normal. Speech and behavior are normal.  ____________________________________________   LABS (all labs ordered are listed, but only abnormal results are displayed)  Labs Reviewed  WET PREP, GENITAL - Abnormal; Notable for the following components:      Result Value   Clue Cells Wet Prep HPF POC PRESENT (*)    WBC, Wet Prep HPF POC RARE (*)    All other components within normal limits  URINALYSIS, COMPLETE (UACMP) WITH MICROSCOPIC - Abnormal; Notable for the following components:   Color, Urine YELLOW (*)    APPearance CLEAR (*)    Hgb urine dipstick SMALL (*)    Squamous Epithelial / LPF 0-5 (*)    All other components within normal limits  BASIC METABOLIC PANEL - Abnormal; Notable for the following components:   Glucose, Bld 102 (*)    Calcium 8.8 (*)    All other components within normal limits  CHLAMYDIA/NGC RT PCR ([facility] ONLY)  CBC   RADIOLOGY   Official radiology report(s): US Pelvic Doppler (torsion R/o Or Mass Arterial Flow)  Result Date: 11/24/2017 CLINICAL DATA:  Pelvic pain and vaginal bleeding. Prior hysterectomy. EXAM: TRANSABDOMINAL AND TRANSVAGINAL ULTRASOUND OF PELVIS DOPPLER ULTRASOUND OF OVARIES TECHNIQUE: Both transabdominal and transvaginal ultrasound examinations of the pelvis were performed. Transabdominal technique was performed for global imaging of the  pelvis including uterus, ovaries, adnexal regions, and pelvic cul-de-sac. It was necessary to proceed with endovaginal exam following the transabdominal exam to visualize the right ovary. Color and duplex Doppler ultrasound was utilized to evaluate blood flow to the ovaries. COMPARISON:  None. FINDINGS: Uterus Surgically absent.  No sonographic abnormality of the vaginal cough. Endometrium Surgically absent. Right ovary Measurements: 6.3 x 2.8 x 4.7 cm. There are 2 complex cysts in the right ovary measuring 1.7 and 2.0 cm respectively. Few peripheral follicles, physiologic. Normal blood flow to the ovarian parenchyma. No adnexal mass. Left ovary Surgically absent.  No adnexal mass. Pulsed Doppler evaluation of the right ovary demonstrates normal low-resistance arterial and venous waveforms. Other findings No abnormal free fluid. IMPRESSION: 1. Two small complex cysts in the right ovary measuring 1.7 and 2 cm respectively, likely hemorrhagic cysts. No dedicated imaging follow-up is needed. Normal blood flow without torsion. 2. Post hysterectomy and left oophorectomy. No sonographic abnormality  of the vaginal cuff. Electronically Signed   By: Jeb Levering M.D.   On: 11/24/2017 06:47   US Pelvic Complete With Transvaginal  Result Date: 11/24/2017 CLINICAL DATA:  Pelvic pain and vaginal bleeding. Prior hysterectomy. EXAM: TRANSABDOMINAL AND TRANSVAGINAL ULTRASOUND OF PELVIS DOPPLER ULTRASOUND OF OVARIES TECHNIQUE: Both transabdominal and transvaginal ultrasound examinations of the pelvis were performed. Transabdominal technique was performed for global imaging of the pelvis including uterus, ovaries, adnexal regions, and pelvic cul-de-sac. It was necessary to proceed with endovaginal exam following the transabdominal exam to visualize the right ovary. Color and duplex Doppler ultrasound was utilized to evaluate blood flow to the ovaries. COMPARISON:  None. FINDINGS: Uterus Surgically absent.  No sonographic  abnormality of the vaginal cough. Endometrium Surgically absent. Right ovary Measurements: 6.3 x 2.8 x 4.7 cm. There are 2 complex cysts in the right ovary measuring 1.7 and 2.0 cm respectively. Few peripheral follicles, physiologic. Normal blood flow to the ovarian parenchyma. No adnexal mass. Left ovary Surgically absent.  No adnexal mass. Pulsed Doppler evaluation of the right ovary demonstrates normal low-resistance arterial and venous waveforms. Other findings No abnormal free fluid. IMPRESSION: 1. Two small complex cysts in the right ovary measuring 1.7 and 2 cm respectively, likely hemorrhagic cysts. No dedicated imaging follow-up is needed. Normal blood flow without torsion. 2. Post hysterectomy and left oophorectomy. No sonographic abnormality of the vaginal cuff. Electronically Signed   By: Jeb Levering M.D.   On: 11/24/2017 06:47    ____________________________________________   PROCEDURES  Procedure(s) performed: None  Procedures  Critical Care performed: No  ____________________________________________   INITIAL IMPRESSION / ASSESSMENT AND PLAN / ED COURSE  Patient is present with complaint of vaginal itching and postcoital bleeding.  She is reassured by her ultrasound and discussion of the cyst on her ovary was explained.  Patient was made aware that she has a bacterial vaginosis which is probably causing both her discharge and vaginal itching.  Patient will follow up with Dr. Ouida Sills who is her OB/GYN.  Patient was discharged with a prescription for Flagyl 500 mg twice daily for 7 days.  ____________________________________________   FINAL CLINICAL IMPRESSION(S) / ED DIAGNOSES  Final diagnoses:  Pain pelvic  BV (bacterial vaginosis)     ED Discharge Orders        Ordered    metroNIDAZOLE (FLAGYL) 500 MG tablet  2 times daily     11/24/17 5726       Note:  This document was prepared using Dragon voice recognition software and may include unintentional  dictation errors.    Johnn Hai, PA-C 11/24/17 1503    Lisa Roca, MD 11/24/17 [phone]

## 2017-11-24 NOTE — Discharge Instructions (Signed)
Call make an appointment with your OB/GYN if any continued problems.  Take Flagyl 500 mg twice daily for 7 days. You may also take Tylenol as needed for pain

## 2017-11-24 NOTE — ED Notes (Signed)
See triage note  Presented with some vaginal bleeding this am  States she has had some vaginal itching and some pain to right lower quad over the past couple of months  States she noticed pain after intercourse     Denies any bleeding at present

## 2017-11-24 NOTE — ED Triage Notes (Signed)
Patient reports that after intercourse for the last 2 months she has severe vaginal itching. Patient reports she had her labs done last.   Patient reports last act of intercourse was Sunday. Patient reports today when she awoke she had vaginal bleeding. Patient had a hysterectomy in 2017, and has not bled since, so is anxious as to the cause of the bleeding.   Patient reports she has right ovary post hysterectomy. Patient reports hx of cysts on her ovaries. Patient reports similar pain to what she experienced 2 Sundays ago with ovarian cysts. Patient denies current pain.    Thursday for her annual exam, and was worried about some of her results.

## 2019-09-23 ENCOUNTER — Ambulatory Visit (INDEPENDENT_AMBULATORY_CARE_PROVIDER_SITE_OTHER): Payer: BLUE CROSS/BLUE SHIELD | Admitting: Urology

## 2019-09-23 ENCOUNTER — Encounter: Payer: Self-pay | Admitting: Urology

## 2019-09-23 ENCOUNTER — Other Ambulatory Visit: Payer: Self-pay

## 2019-09-23 VITALS — BP 149/68 | HR 96 | Ht 66.0 in | Wt 178.0 lb

## 2019-09-23 DIAGNOSIS — N941 Unspecified dyspareunia: Secondary | ICD-10-CM | POA: Insufficient documentation

## 2019-09-23 DIAGNOSIS — R3121 Asymptomatic microscopic hematuria: Secondary | ICD-10-CM | POA: Diagnosis not present

## 2019-09-23 LAB — URINALYSIS, COMPLETE
Bilirubin, UA: NEGATIVE
Glucose, UA: NEGATIVE
Ketones, UA: NEGATIVE
Leukocytes,UA: NEGATIVE
Nitrite, UA: NEGATIVE
RBC, UA: NEGATIVE
Specific Gravity, UA: 1.025 (ref 1.005–1.030)
Urobilinogen, Ur: 1 mg/dL (ref 0.2–1.0)
pH, UA: 7 (ref 5.0–7.5)

## 2019-09-23 LAB — MICROSCOPIC EXAMINATION: RBC: NONE SEEN /hpf (ref 0–2)

## 2019-09-23 NOTE — Patient Instructions (Signed)
Conjugated Estrogens injection What is this medicine? CONJUGATED ESTROGENS (CON ju gate ed ESS troe jenz) is a mixture of female hormones. It is used to treat abnormal bleeding from the uterus caused by a hormonal imbalance. This medicine may also be used for a short period of time to increase your body's estrogen levels. This medicine may be used for other purposes; ask your health care provider or pharmacist if you have questions. COMMON BRAND NAME(S): Premarin What should I tell my health care provider before I take this medicine? They need to know if you have any of these conditions:  blood vessel disease, blood clotting disorder, or suffered a stroke  breast, cervical, endometrial, ovarian or uterine cancer  dementia  gallbladder disease  heart disease  high blood levels of calcium  kidney disease  liver disease  protein C deficiency  protein S deficiency  vaginal bleeding  an unusual or allergic reaction to estrogens, other hormones, medicines, foods, dyes, or preservatives  pregnant or trying to get pregnant  breast-feeding How should I use this medicine? This medicine is for injection into a vein, or injection into a muscle. It is given by a health care professional in a hospital or clinic setting. A patient package insert for the product will be given with each prescription and refill. Read this sheet carefully each time. The sheet may change frequently. Talk to your pediatrician regarding the use of this medicine in children. Special care may be needed. Overdosage: If you think you have taken too much of this medicine contact a poison control center or emergency room at once. NOTE: This medicine is only for you. Do not share this medicine with others. What if I miss a dose? This does not apply. What may interact with this medicine? Do not take this medicine with any of the following medications:  exemestane This medicine may also interact with the following  medications:  barbiturates or benzodiazepines used for inducing sleep or treating seizures (convulsions)  carbamazepine  grapefruit juice  medicines for fungal infections like ketoconazole and itraconazole  raloxifene or tamoxifen  rifabutin, rifampin, or rifapentine  ritonavir  some antibiotics used to treat infections  St. John's Wort  warfarin This list may not describe all possible interactions. Give your health care provider a list of all the medicines, herbs, non-prescription drugs, or dietary supplements you use. Also tell them if you smoke, drink alcohol, or use illegal drugs. Some items may interact with your medicine. What should I watch for while using this medicine? This medicine can make your body retain fluid, making your fingers, hands, or ankles swell. Your blood pressure can go up. Contact your doctor or health care professional if you feel you are retaining fluid. If you have any reason to think you are pregnant, stop taking this medicine right away and contact your doctor or health care professional. Smoking increases the risk of getting a blood clot or having a stroke while you are taking this medicine, especially if you are more than 35 years old. You are strongly advised not to smoke. If you are going to have surgery, you may need to stop taking this medicine. Consult your health care professional for advice before you schedule the surgery. What side effects may I notice from receiving this medicine? Side effects that you should report to your doctor or health care professional as soon as possible:  allergic reactions like skin rash, itching or hives, swelling of the face, lips, or tongue  breakthrough bleeding and  spotting  breast enlargement, tenderness, or abnormal production of milk  breathing problems  changes in vision  chest pain  confusion or forgetfulness  dark urine  general ill feeling or flu-like symptoms  leg, arm, or groin  pain  light-colored stools  loss of appetite, nausea  nausea, vomiting  right upper belly pain  severe headaches  stomach pain  speech problems  unusually weak or tired  yellowing of the eyes or skin Side effects that usually do not require medical attention (report to your doctor or health care professional if they continue or are bothersome):  change in appetite  mood changes, anxiety, depression, frustration, anger, or emotional outbursts  skin acne or brown spots on the face  weight gain This list may not describe all possible side effects. Call your doctor for medical advice about side effects. You may report side effects to FDA at 1-800-FDA-1088. Where should I keep my medicine? This drug is given in a hospital or clinic and will not be stored at home. NOTE: This sheet is a summary. It may not cover all possible information. If you have questions about this medicine, talk to your doctor, pharmacist, or health care provider.  2020 Elsevier/Gold Standard (2010-11-22 09:21:17)

## 2019-09-23 NOTE — Progress Notes (Signed)
09/23/19 9:21 AM   Christy Kennedy 1986/06/30 [id number]  Referring provider: Glendon Axe, MD [address] [place],  [place] [id number]  CC: Asymptomatic microscopic hematuria  HPI: I saw Mr. Christy Kennedy urology clinic in consultation for asymptomatic microscopic hematuria from Dr. Candiss Norse.  She is a healthy 34 year old female with past medical history notable for hypertension and hysterectomy.  She is referred for 2 episodes of asymptomatic microscopic hematuria with 4-10 RBCs per high-power field in the last 3 years.  She denies any history of gross hematuria or urinary symptoms.  She occasionally will have some dyspareunia and dysuria after intercourse.  She was prescribed Premarin cream by her PCP, but did not start this because she could not afford it.  She is a never smoker.  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  She denies any flank pain or history of nephrolithiasis.  She denies any recurrent UTIs.  PMH: Past Medical History:  Diagnosis Date  . Anemia    iron deficiency anemia  . Hypertension     Surgical History: Past Surgical History:  Procedure Laterality Date  . ABDOMINAL HYSTERECTOMY    . DIAGNOSTIC LAPAROSCOPY    . DILATION AND CURETTAGE OF UTERUS    . LAPAROSCOPIC ASSISTED VAGINAL HYSTERECTOMY N/A 02/20/2016   Procedure: LAPAROSCOPIC ASSISTED VAGINAL HYSTERECTOMY, LEFT OOPHERECTOMY, LYSIS OF ADHESIONS;  Surgeon: Boykin Nearing, MD;  Location: [facility] ORS;  Service: Gynecology;  Laterality: N/A;  . OOPHORECTOMY Left 02/20/2016   Procedure: OOPHORECTOMY;  Surgeon: Boykin Nearing, MD;  Location: [facility] ORS;  Service: Gynecology;  Laterality: Left;    Allergies: No Known Allergies  Family History: No family history on file.  Social History:  reports that she has never smoked. She has never used smokeless tobacco. She reports current alcohol use. She reports that she does not use  drugs.  ROS: Please see flowsheet from today's date for complete review of systems.  Physical Exam: BP (!) 149/68 (BP Location: Left Arm, Patient Position: Sitting, Cuff Size: Normal)   Pulse 96   Ht 5\' 6"  (1.676 m)   Wt 178 lb (80.7 kg)   LMP 01/27/2016 (Exact Date)   BMI 28.73 kg/m    Constitutional:  Alert and oriented, No acute distress. Cardiovascular: No clubbing, cyanosis, or edema. Respiratory: Normal respiratory effort, no increased work of breathing. GI: Abdomen is soft, nontender, nondistended, no abdominal masses GU: No CVA tenderness Lymph: No cervical or inguinal lymphadenopathy. Skin: No rashes, bruises or suspicious lesions. Neurologic: Grossly intact, no focal deficits, moving all 4 extremities. Psychiatric: Normal mood and affect.  Laboratory Data: Urinalysis history reviewed, see care everywhere  Pertinent Imaging: None to review  Assessment & Plan:   In summary, she is a healthy 34 year old female with history of a hysterectomy and 2 episodes of mild asymptomatic microscopic hematuria with 4-10 RBCs per high-power field over the last 4 years.  We discussed common possible etiologies of microscopic hematuria including idiopathic, urolithiasis, medical renal disease, and malignancy. We discussed the new asymptomatic microscopic hematuria guidelines and risk categories of low, intermediate, and high risk that are based on age, risk factors like smoking, and degree of microscopic hematuria. We discussed work-up can range from repeat urinalysis, renal ultrasound and cystoscopy, to CT urogram and cystoscopy.  They fall into the low risk category, and I recommended proceeding with renal ultrasound.  With her lack of risk factors and low-grade microscopic hematuria, I  did not recommend pursuing cystoscopy at this time, we discussed the extremely low, but not-zero, risk of missing potential bladder pathology.  Samples of Premarin cream given today Renal ultrasound  ordered, call with results Follow-up as needed  A total of 40 minutes were spent face-to-face with the patient, greater than 50% was spent in patient education, counseling, and coordination of care regarding asymptomatic microscopic hematuria.   Billey Co, [facility] [address] [place], [place] [address] [phone]

## 2019-10-01 ENCOUNTER — Other Ambulatory Visit: Payer: Self-pay

## 2019-10-01 ENCOUNTER — Ambulatory Visit
Admission: RE | Admit: 2019-10-01 | Discharge: 2019-10-01 | Disposition: A | Discharge status: Home / Self Care | Payer: BLUE CROSS/BLUE SHIELD | Source: Ambulatory Visit | Attending: Urology | Admitting: Urology

## 2019-10-01 DIAGNOSIS — R3121 Asymptomatic microscopic hematuria: Secondary | ICD-10-CM | POA: Diagnosis not present

## 2019-10-02 ENCOUNTER — Telehealth: Payer: Self-pay

## 2019-10-02 DIAGNOSIS — N200 Calculus of kidney: Secondary | ICD-10-CM

## 2019-10-02 NOTE — Telephone Encounter (Signed)
-----   Message from Billey Co, MD sent at 10/01/2019  4:38 PM EST ----- There is a medium sized kidney stone in the left kidney, not causing any blockage or symptoms but is probably where the microscopic blood is coming from. RTC 6 mo with KUB to make sure stone is not enlarging. Important to hydrate to prevent further stones, call if left flank pain  Olivia Mackie: Please schedule follow up in 6 months with KUB   Nickolas Madrid, MD 10/01/2019

## 2019-10-02 NOTE — Telephone Encounter (Signed)
Called pt informed her of the information below. Pt gave verbal understanding. Appt scheduled. Kub ordered.

## 2020-03-17 ENCOUNTER — Ambulatory Visit
Admission: RE | Admit: 2020-03-17 | Discharge: 2020-03-17 | Disposition: A | Discharge status: Home / Self Care | Payer: BLUE CROSS/BLUE SHIELD | Source: Ambulatory Visit | Attending: Urology | Admitting: Urology

## 2020-03-17 ENCOUNTER — Ambulatory Visit
Admission: RE | Admit: 2020-03-17 | Discharge: 2020-03-17 | Disposition: A | Discharge status: Home / Self Care | Payer: BLUE CROSS/BLUE SHIELD | Attending: Urology | Admitting: Urology

## 2020-03-17 DIAGNOSIS — N200 Calculus of kidney: Secondary | ICD-10-CM | POA: Insufficient documentation

## 2020-03-30 ENCOUNTER — Encounter: Payer: Self-pay | Admitting: Urology

## 2020-03-30 ENCOUNTER — Ambulatory Visit (INDEPENDENT_AMBULATORY_CARE_PROVIDER_SITE_OTHER): Payer: BLUE CROSS/BLUE SHIELD | Admitting: Urology

## 2020-03-30 VITALS — BP 135/78 | HR 81 | Ht 66.0 in | Wt 184.0 lb

## 2020-03-30 DIAGNOSIS — R3121 Asymptomatic microscopic hematuria: Secondary | ICD-10-CM | POA: Diagnosis not present

## 2020-03-30 NOTE — Progress Notes (Signed)
   03/30/2020 8:49 AM   Christy Kennedy [birth year] [id number]  Reason for visit: Follow up history of low risk microscopic hematuria, nephrolithiasis  HPI: I saw [name] back in urology clinic for follow-up of the above issues.  I saw her originally in January 2021 for 2 episodes of asymptomatic microscopic hematuria with 4-10 RBCs.  She opted for a renal ultrasound for follow-up which showed no hydronephrosis or mass, but suggested a 9 mm left lower pole renal stone.  She does not have a history of nephrolithiasis.  She denies any problems since we saw her last.  She continues to deny any gross hematuria or flank pain.  KUB performed on 7/16 showed no evidence of radiopaque stones.  Urine pH is 7, which would make her uric acid stone unlikely.    Most recent repeat urinalysis in May 2021 showed no microscopic hematuria.  We again reviewed possible etiologies of microscopic hematuria, and work-up options ranging from renal ultrasound, CT, and cystoscopy in clinic.  With her young age and absence of risk factors, I recommended ongoing observation, with a repeat urinalysis in 1 year.  If she had significant hematuria with greater than 25 RBCs, could consider CT and/or cystoscopy.  We discussed return precautions at length including gross hematuria, recurrent UTIs, or flank pain.  RTC 1 year with UA prior  Billey Co, MD  [facility] [address], [place], [place] [address] [phone]

## 2020-07-08 ENCOUNTER — Other Ambulatory Visit: Payer: Self-pay | Admitting: Family Medicine

## 2020-07-08 ENCOUNTER — Ambulatory Visit
Admission: RE | Admit: 2020-07-08 | Discharge: 2020-07-08 | Disposition: A | Discharge status: Home / Self Care | Payer: BLUE CROSS/BLUE SHIELD | Source: Ambulatory Visit | Attending: Family Medicine | Admitting: Family Medicine

## 2020-07-08 ENCOUNTER — Other Ambulatory Visit: Payer: Self-pay

## 2020-07-08 DIAGNOSIS — R102 Pelvic and perineal pain unspecified side: Secondary | ICD-10-CM

## 2020-07-08 DIAGNOSIS — N838 Other noninflammatory disorders of ovary, fallopian tube and broad ligament: Secondary | ICD-10-CM | POA: Insufficient documentation

## 2020-07-08 DIAGNOSIS — R1084 Generalized abdominal pain: Secondary | ICD-10-CM

## 2020-07-08 DIAGNOSIS — R300 Dysuria: Secondary | ICD-10-CM | POA: Insufficient documentation

## 2020-07-08 MED ORDER — IOHEXOL 300 MG/ML  SOLN
100.0000 mL | Freq: Once | INTRAMUSCULAR | Status: AC | PRN
  Administered 2020-07-08: 100 mL via INTRAVENOUS

## 2021-03-30 ENCOUNTER — Other Ambulatory Visit: Payer: Self-pay

## 2021-03-30 ENCOUNTER — Encounter: Payer: Self-pay | Admitting: Urology

## 2021-03-30 ENCOUNTER — Ambulatory Visit: Payer: Self-pay | Admitting: Urology

## 2021-03-30 ENCOUNTER — Ambulatory Visit (INDEPENDENT_AMBULATORY_CARE_PROVIDER_SITE_OTHER): Payer: BLUE CROSS/BLUE SHIELD | Admitting: Urology

## 2021-03-30 VITALS — BP 129/73 | HR 82 | Ht 66.0 in | Wt 183.0 lb

## 2021-03-30 DIAGNOSIS — N200 Calculus of kidney: Secondary | ICD-10-CM

## 2021-03-30 DIAGNOSIS — Z87448 Personal history of other diseases of urinary system: Secondary | ICD-10-CM

## 2021-03-30 NOTE — Progress Notes (Signed)
   03/30/2021 4:39 PM   Christy Kennedy November 19, 1985 [id number]  Reason for visit: Follow up microscopic hematuria, nephrolithiasis  HPI: I saw Christy Kennedy back in clinic for follow-up of microscopic hematuria and nephrolithiasis.  She is a healthy 35 year old female who I previously followed for microscopic hematuria with 4-10 RBCs on 2 urine samples.  Renal ultrasound was benign aside from a 9 mm left lower pole stone.  She has no history of spontaneously passed stones.  She has not had any gross hematuria over the last year.  She had some pelvic pain in November 2021 which prompted a CT scan.  I personally viewed and interpreted the CT abdomen and pelvis with contrast dated 07/08/2020 that shows no hydronephrosis and normal-appearing kidneys, and a 4 mm left lower pole nonobstructive stone.  We discussed nephrolithiasis as the most likely cause of her microscopic hematuria, and prevention strategies discussed at length. We discussed general stone prevention strategies including adequate hydration with goal of producing 2.5 L of urine daily, increasing citric acid intake, increasing calcium intake during high oxalate meals, minimizing animal protein, and decreasing salt intake. Information about dietary recommendations given today.   Follow-up with urology as needed    Billey Co, [place] [address], [place], [place] [address] [phone]

## 2021-03-30 NOTE — Patient Instructions (Signed)
Dietary Guidelines to Help Prevent Kidney Stones Kidney stones are deposits of minerals and salts that form inside your kidneys. Your risk of developing kidney stones may be greater depending on your diet, your lifestyle, the medicines you take, and whether you have certain medical conditions. Most people can lower their chances of developing kidney stones by following the instructions below. Your dietitian may give you more specific instructions depending on your overall health and the type of kidney stones you tend to develop. What are tips for following this plan? Reading food labels  Choose foods with "no salt added" or "low-salt" labels. Limit your salt (sodium) intake to less than 1,500 mg a day. Choose foods with calcium for each meal and snack. Try to eat about 300 mg of calcium at each meal. Foods that contain 200-500 mg of calcium a serving include: 8 oz (237 mL) of milk, calcium-fortifiednon-dairy milk, and calcium-fortifiedfruit juice. Calcium-fortified means that calcium has been added to these drinks. 8 oz (237 mL) of kefir, yogurt, and soy yogurt. 4 oz (114 g) of tofu. 1 oz (28 g) of cheese. 1 cup (150 g) of dried figs. 1 cup (91 g) of cooked broccoli. One 3 oz (85 g) can of sardines or mackerel. Most people need 1,000-1,500 mg of calcium a day. Talk to your dietitian about how much calcium is recommended for you. Shopping Buy plenty of fresh fruits and vegetables. Most people do not need to avoid fruits and vegetables, even if these foods contain nutrients that may contribute to kidney stones. When shopping for convenience foods, choose: Whole pieces of fruit. Pre-made salads with dressing on the side. Low-fat fruit and yogurt smoothies. Avoid buying frozen meals or prepared deli foods. These can be high in sodium. Look for foods with live cultures, such as yogurt and kefir. Choose high-fiber grains, such as whole-wheat breads, oat bran, and wheat cereals. Cooking Do not add  salt to food when cooking. Place a salt shaker on the table and allow each person to add his or her own salt to taste. Use vegetable protein, such as beans, textured vegetable protein (TVP), or tofu, instead of meat in pasta, casseroles, and soups. Meal planning Eat less salt, if told by your dietitian. To do this: Avoid eating processed or pre-made food. Avoid eating fast food. Eat less animal protein, including cheese, meat, poultry, or fish, if told by your dietitian. To do this: Limit the number of times you have meat, poultry, fish, or cheese each week. Eat a diet free of meat at least 2 days a week. Eat only one serving each day of meat, poultry, fish, or seafood. When you prepare animal protein, cut pieces into small portion sizes. For most meat and fish, one serving is about the size of the palm of your hand. Eat at least five servings of fresh fruits and vegetables each day. To do this: Keep fruits and vegetables on hand for snacks. Eat one piece of fruit or a handful of berries with breakfast. Have a salad and fruit at lunch. Have two kinds of vegetables at dinner. Limit foods that are high in a substance called oxalate. These include: Spinach (cooked), rhubarb, beets, sweet potatoes, and Swiss chard. Peanuts. Potato chips, french fries, and baked potatoes with skin on. Nuts and nut products. Chocolate. If you regularly take a diuretic medicine, make sure to eat at least 1 or 2 servings of fruits or vegetables that are high in potassium each day. These include: Avocado. Banana. Orange, prune,   carrot, or tomato juice. Baked potato. Cabbage. Beans and split peas. Lifestyle  Drink enough fluid to keep your urine pale yellow. This is the most important thing you can do. Spread your fluid intake throughout the day. If you drink alcohol: Limit how much you use to: 0-1 drink a day for women who are not pregnant. 0-2 drinks a day for men. Be aware of how much alcohol is in your  drink. In the [place] one drink equals one 12 oz bottle of beer (355 mL), one 5 oz glass of wine (148 mL), or one 1 oz glass of hard liquor (44 mL). Lose weight if told by your health care provider. Work with your dietitian to find an eating plan and weight loss strategies that work best for you. General information Talk to your health care provider and dietitian about taking daily supplements. You may be told the following depending on your health and the cause of your kidney stones: Not to take supplements with vitamin C. To take a calcium supplement. To take a daily probiotic supplement. To take other supplements such as magnesium, fish oil, or vitamin B6. Take over-the-counter and prescription medicines only as told by your health care provider. These include supplements. What foods should I limit? Limit your intake of the following foods, or eat them as told by your dietitian. Vegetables Spinach. Rhubarb. Beets. Canned vegetables. Pickles. Olives. Baked potatoes with skin. Grains Wheat bran. Baked goods. Salted crackers. Cereals high in sugar. Meats and other proteins Nuts. Nut butters. Large portions of meat, poultry, or fish. Salted, precooked, or cured meats, such as sausages, meat loaves, and hot dogs. Dairy Cheese. Beverages Regular soft drinks. Regular vegetable juice. Seasonings and condiments Seasoning blends with salt. Salad dressings. Soy sauce. Ketchup. Barbecue sauce. Other foods Canned soups. Canned pasta sauce. Casseroles. Pizza. Lasagna. Frozen meals. Potato chips. French fries. The items listed above may not be a complete list of foods and beverages you should limit. Contact a dietitian for more information. What foods should I avoid? Talk to your dietitian about specific foods you should avoid based on the type of kidney stones you have and your overall health. Fruits Grapefruit. The item listed above may not be a complete list of foods and beverages you should  avoid. Contact a dietitian for more information. Summary Kidney stones are deposits of minerals and salts that form inside your kidneys. You can lower your risk of kidney stones by making changes to your diet. The most important thing you can do is drink enough fluid. Drink enough fluid to keep your urine pale yellow. Talk to your dietitian about how much calcium you should have each day, and eat less salt and animal protein as told by your dietitian. This information is not intended to replace advice given to you by your health care provider. Make sure you discuss any questions you have with your health care provider. Document Revised: 08/13/2019 Document Reviewed: 08/13/2019 Elsevier Patient Education  2022 Elsevier Inc.  

## 2021-03-31 LAB — MICROSCOPIC EXAMINATION: Epithelial Cells (non renal): >10 /hpf — AB (ref 0–10)

## 2021-03-31 LAB — URINALYSIS, COMPLETE
Bilirubin, UA: NEGATIVE
Glucose, UA: NEGATIVE
Ketones, UA: NEGATIVE
Leukocytes,UA: NEGATIVE
Nitrite, UA: NEGATIVE
Protein,UA: NEGATIVE
Specific Gravity, UA: >1.03 — ABNORMAL HIGH (ref 1.005–1.030)
Urobilinogen, Ur: 0.2 mg/dL (ref 0.2–1.0)
pH, UA: 5.5 (ref 5.0–7.5)

## 2022-05-05 IMAGING — CR DG ABDOMEN 1V
1 series · 2 of 2 positions shown · non-contrast
Comparison: 10/01/2019 renal sonogram

CLINICAL DATA: Follow-up nephrolithiasis

EXAM:
ABDOMEN - 1 VIEW

[Series 1: dg abd 1 view · 0.14mm/px · 2 of 2 slices shown]
[im 1/2]
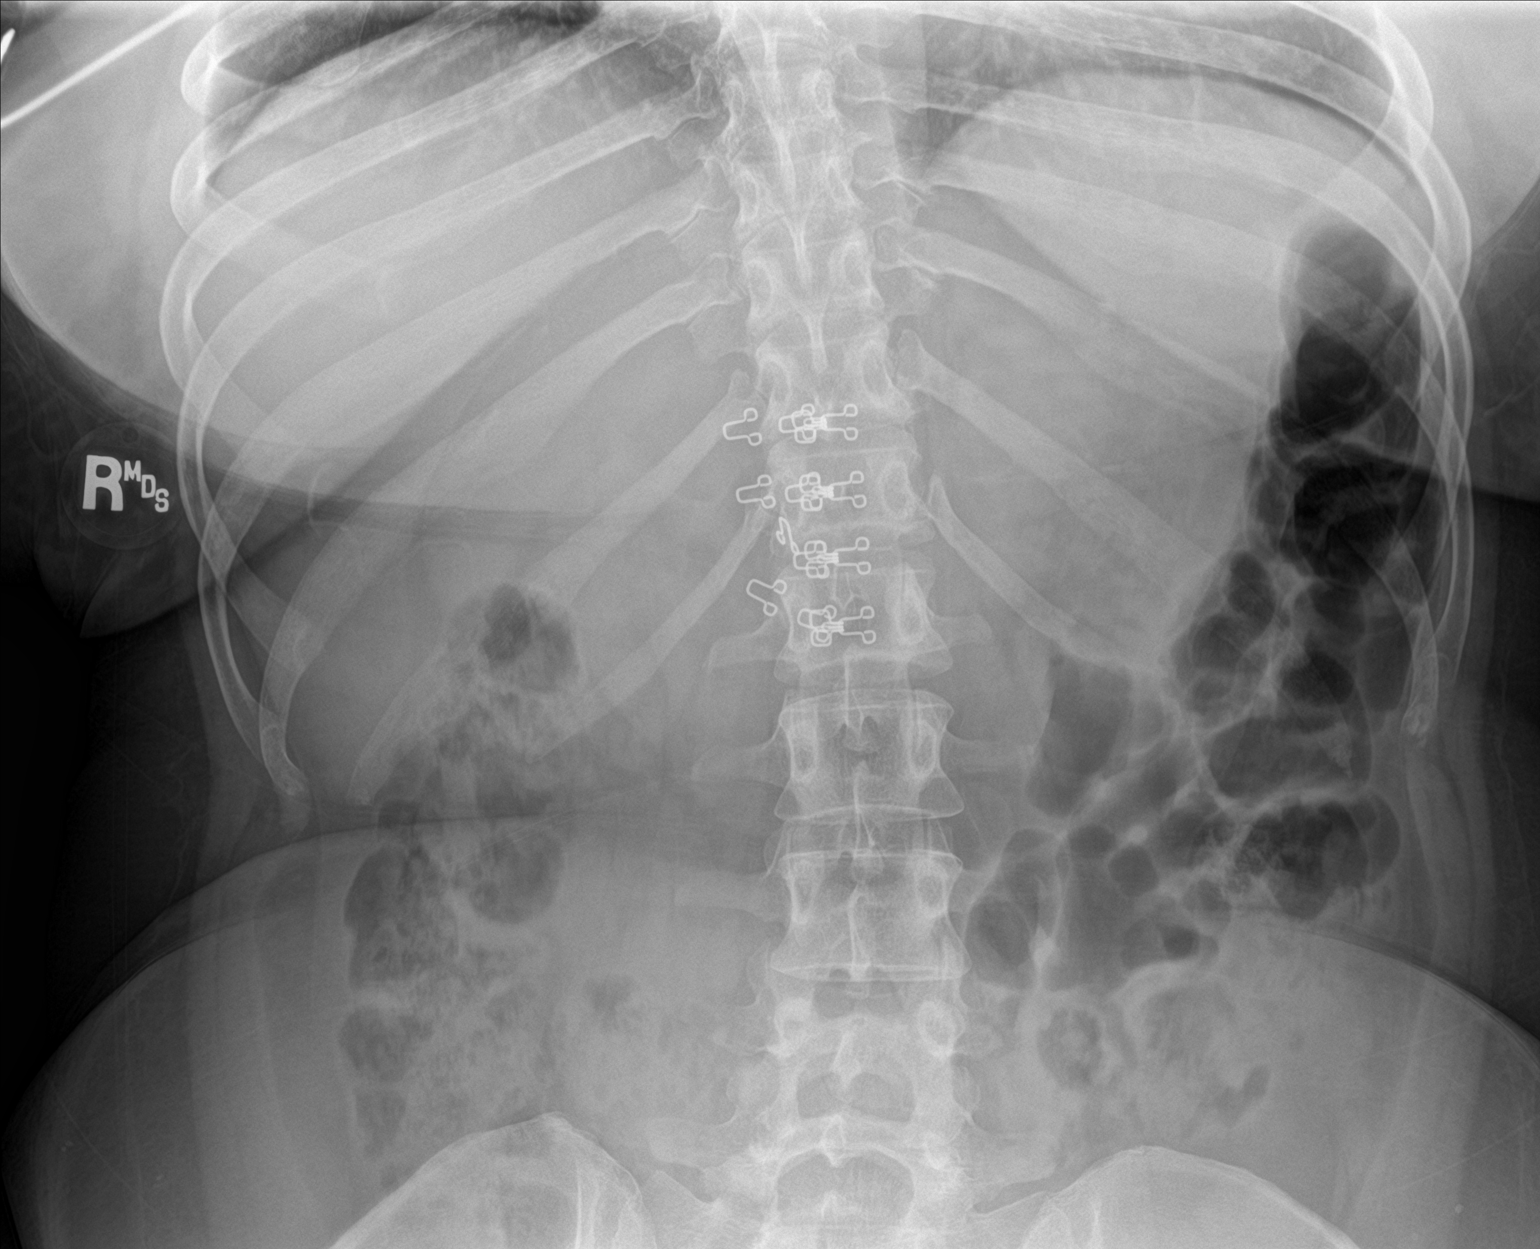
[im 2/2]
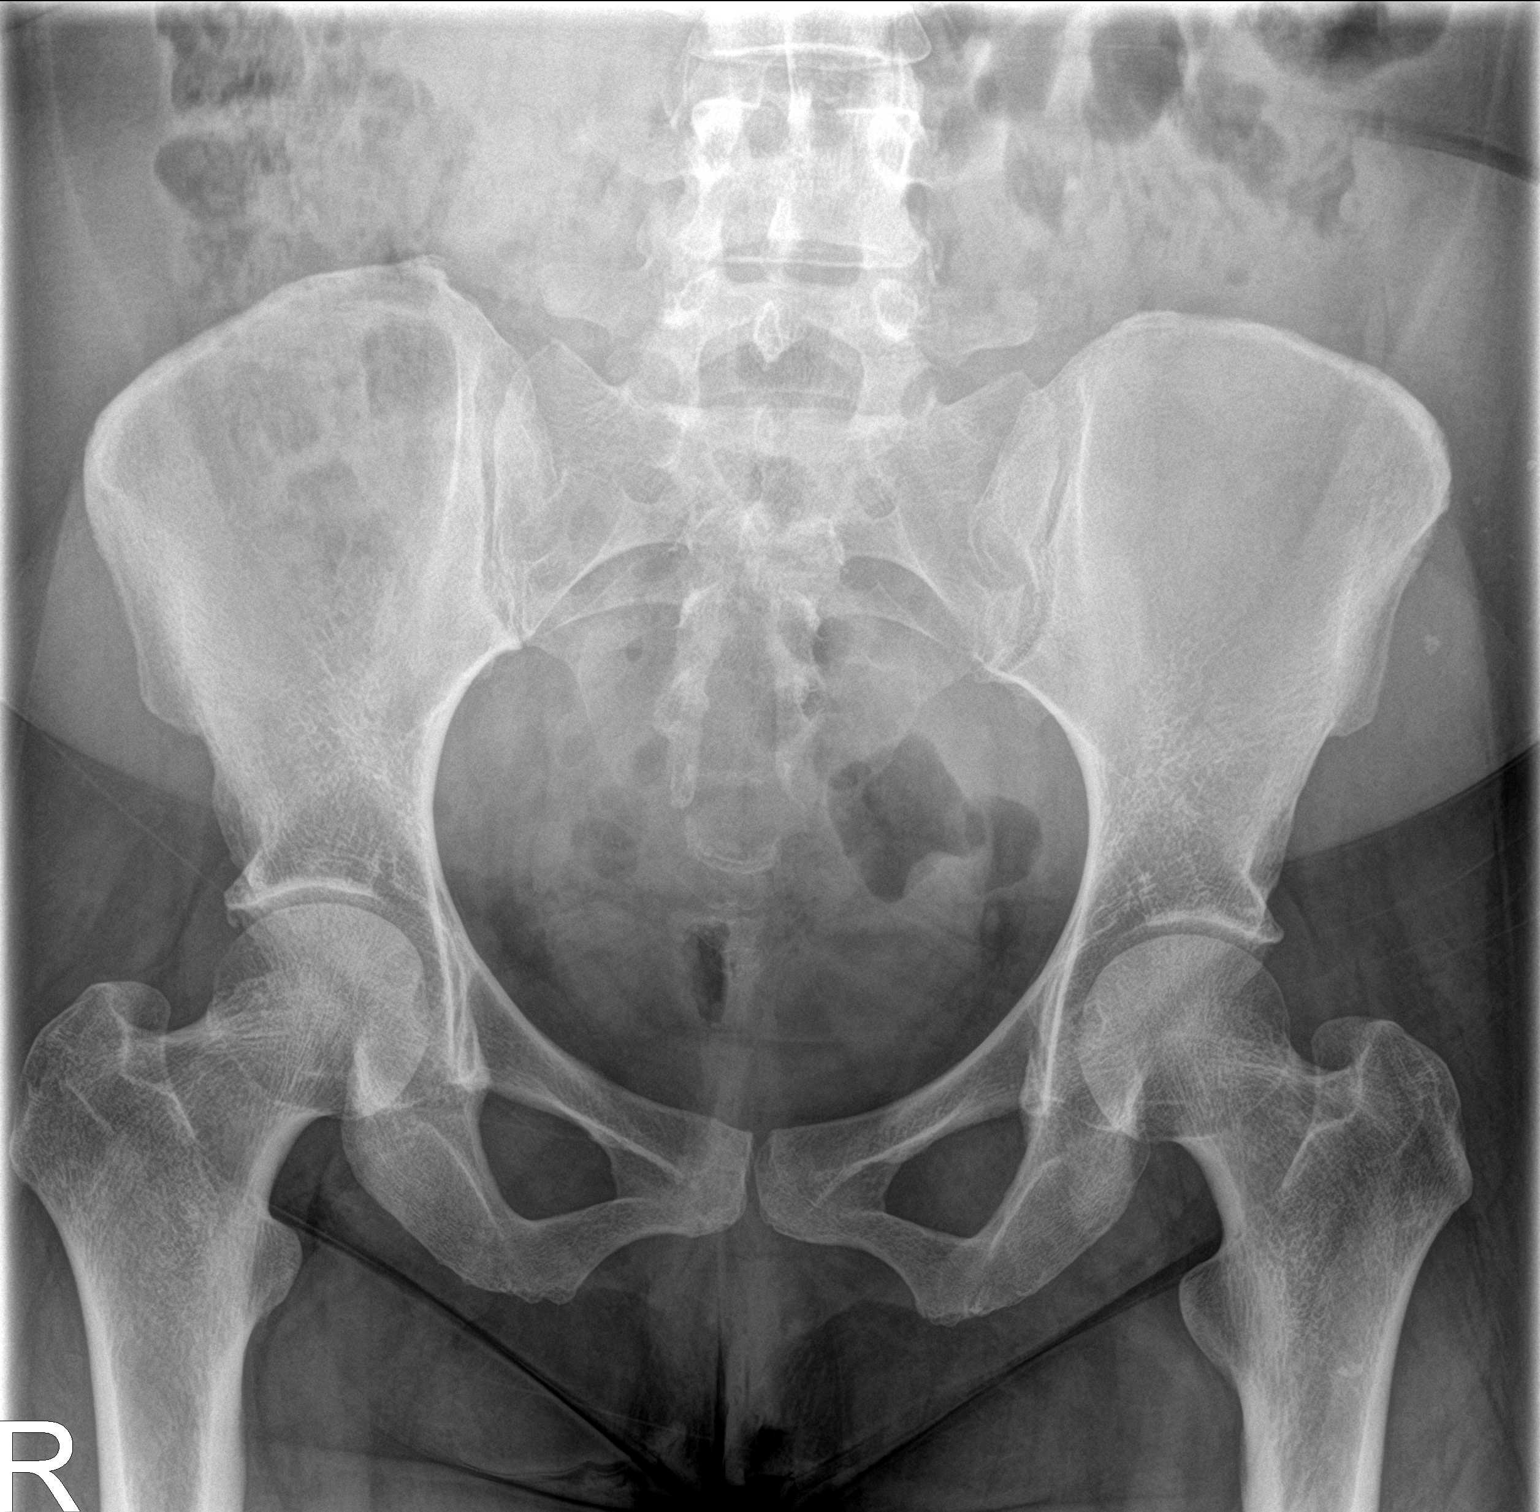

[2 of 2 positions shown; findings below may reference images not displayed]

FINDINGS: No radiopaque stones overlie the kidneys, ureters or bladder.
Kidneys partially obscured by stool and gas. No dilated small bowel
loops. Moderate colonic stool. No evidence of pneumatosis or
pneumoperitoneum. Clear lung bases.
IMPRESSION: No radiopaque urolithiasis.

## 2022-09-27 ENCOUNTER — Other Ambulatory Visit: Payer: Self-pay | Admitting: Family Medicine

## 2022-09-27 ENCOUNTER — Ambulatory Visit
Admission: RE | Admit: 2022-09-27 | Discharge: 2022-09-27 | Disposition: A | Discharge status: Home / Self Care | Payer: BLUE CROSS/BLUE SHIELD | Source: Ambulatory Visit | Attending: Family Medicine | Admitting: Family Medicine

## 2022-09-27 DIAGNOSIS — M79605 Pain in left leg: Secondary | ICD-10-CM

## 2023-01-16 ENCOUNTER — Emergency Department: Payer: BC Managed Care – PPO

## 2023-01-16 ENCOUNTER — Encounter: Payer: Self-pay | Admitting: Emergency Medicine

## 2023-01-16 ENCOUNTER — Emergency Department
Admission: EM | Admit: 2023-01-16 | Discharge: 2023-01-16 | Disposition: A | Discharge status: Home / Self Care | Payer: BC Managed Care – PPO | Attending: Student in an Organized Health Care Education/Training Program | Admitting: Student in an Organized Health Care Education/Training Program

## 2023-01-16 ENCOUNTER — Other Ambulatory Visit: Payer: Self-pay

## 2023-01-16 DIAGNOSIS — R1110 Vomiting, unspecified: Secondary | ICD-10-CM | POA: Insufficient documentation

## 2023-01-16 DIAGNOSIS — Z20822 Contact with and (suspected) exposure to covid-19: Secondary | ICD-10-CM | POA: Diagnosis not present

## 2023-01-16 DIAGNOSIS — R5381 Other malaise: Secondary | ICD-10-CM | POA: Diagnosis not present

## 2023-01-16 DIAGNOSIS — R0789 Other chest pain: Secondary | ICD-10-CM | POA: Insufficient documentation

## 2023-01-16 DIAGNOSIS — R0600 Dyspnea, unspecified: Secondary | ICD-10-CM

## 2023-01-16 LAB — BASIC METABOLIC PANEL
Anion gap: 7 (ref 5–15)
BUN: 12 mg/dL (ref 6–20)
CO2: 25 mmol/L (ref 22–32)
Calcium: 9 mg/dL (ref 8.9–10.3)
Chloride: 105 mmol/L (ref 98–111)
Creatinine, Ser: 0.51 mg/dL (ref 0.44–1.00)
GFR, Estimated: >60 mL/min (ref >60)
Glucose, Bld: 98 mg/dL (ref 70–99)
Potassium: 3.6 mmol/L (ref 3.5–5.1)
Sodium: 137 mmol/L (ref 135–145)

## 2023-01-16 LAB — CBC
HCT: 36.6 % (ref 36.0–46.0)
Hemoglobin: 11.4 g/dL — ABNORMAL LOW (ref 12.0–15.0)
MCH: 26.9 pg (ref 26.0–34.0)
MCHC: 31.1 g/dL (ref 30.0–36.0)
MCV: 86.3 fL (ref 80.0–100.0)
Platelets: 252 10*3/uL (ref 150–400)
RBC: 4.24 MIL/uL (ref 3.87–5.11)
RDW: 13 % (ref 11.5–15.5)
WBC: 3.7 10*3/uL — ABNORMAL LOW (ref 4.0–10.5)
nRBC: 0 % (ref 0.0–0.2)

## 2023-01-16 LAB — SARS CORONAVIRUS 2 BY RT PCR: SARS Coronavirus 2 by RT PCR: NEGATIVE

## 2023-01-16 LAB — TROPONIN I (HIGH SENSITIVITY): Troponin I (High Sensitivity): <2 ng/L (ref <18)

## 2023-01-16 MED ORDER — ALBUTEROL SULFATE HFA 108 (90 BASE) MCG/ACT IN AERS: 2.0000 | INHALATION_SPRAY | Freq: Four times a day (QID) | RESPIRATORY_TRACT | 2 refills | Status: AC | PRN

## 2023-01-16 MED ORDER — ALBUTEROL SULFATE (2.5 MG/3ML) 0.083% IN NEBU
2.5000 mg | INHALATION_SOLUTION | Freq: Once | RESPIRATORY_TRACT | Status: AC
  Administered 2023-01-16: 2.5 mg via RESPIRATORY_TRACT
  Filled 2023-01-16: qty 3

## 2023-01-16 NOTE — ED Provider Notes (Signed)
[facility] Provider Note    Event Date/Time   First MD Initiated Contact with Patient 01/16/23 2100     (approximate)   History   Chest Pain and Shortness of Breath   HPI  Christy Kennedy is a 37 y.o. female no significant cardiac or pulmonary history presents to the ER for evaluation of chest discomfort as well as shortness of breath after nausea vomiting illness that started at the beginning of the week.  Had several episodes of nonbilious nonbloody emesis.  Felt like she was having some shortness of breath after vomiting.  States that overall she is feeling improved but was having generalized malaise.  Does have children at home that were sick with recent cold.  She denies any abdominal pain at this time no chest pain.  Still having some shortness of breath.  She not on any birth control.  No history of bronchitis or asthma no history of smoking.  No lower extremity swelling.  DVT or PE.     Physical Exam   Triage Vital Signs: ED Triage Vitals  Enc Vitals Group     BP 01/16/23 1758 134/72     Pulse Rate 01/16/23 1758 79     Resp 01/16/23 1758 18     Temp 01/16/23 1758 98.7 F (37.1 C)     Temp Source 01/16/23 1758 Oral     SpO2 01/16/23 1758 99 %     Weight 01/16/23 1754 180 lb (81.6 kg)     Height 01/16/23 1754 5\' 6"  (1.676 m)     Head Circumference --      Peak Flow --      Pain Score 01/16/23 1753 1     Pain Loc --      Pain Edu? --      Excl. in GC? --     Most recent vital signs: Vitals:   01/16/23 1758 01/16/23 2106  BP: 134/72 (!) 158/73  Pulse: 79 73  Resp: 18 14  Temp: 98.7 F (37.1 C)   SpO2: 99% 100%     Constitutional: Alert  Eyes: Conjunctivae are normal.  Head: Atraumatic. Nose: No congestion/rhinnorhea. Mouth/Throat: Mucous membranes are moist.   Neck: Painless ROM.  Cardiovascular:   Good peripheral circulation. Respiratory: Normal respiratory effort.  No retractions.  Gastrointestinal: Soft and nontender.   Musculoskeletal:  no deformity Neurologic:  MAE spontaneously. No gross focal neurologic deficits are appreciated.  Skin:  Skin is warm, dry and intact. No rash noted. Psychiatric: Mood and affect are normal. Speech and behavior are normal.    ED Results / Procedures / Treatments   Labs (all labs ordered are listed, but only abnormal results are displayed) Labs Reviewed  CBC - Abnormal; Notable for the following components:      Result Value   WBC 3.7 (*)    Hemoglobin 11.4 (*)    All other components within normal limits  SARS CORONAVIRUS 2 BY RT PCR  BASIC METABOLIC PANEL  TROPONIN I (HIGH SENSITIVITY)     EKG  ED ECG REPORT I, Willy Eddy, the attending physician, personally viewed and interpreted this ECG.   Date: 01/16/2023  EKG Time: 7:55  Rate: 70  Rhythm: sinus  Axis: normal  Intervals:normal  ST&T Change: no stemi, no depressions    RADIOLOGY Please see ED Course for my review and interpretation.  I personally reviewed all radiographic images ordered to evaluate for the above acute complaints and reviewed radiology reports and findings.  These findings were personally discussed with the patient.  Please see medical record for radiology report.    PROCEDURES:  Critical Care performed: No  Procedures   MEDICATIONS ORDERED IN ED: Medications  albuterol (PROVENTIL) (2.5 MG/3ML) 0.083% nebulizer solution 2.5 mg (2.5 mg Nebulization Given 01/16/23 2130)     IMPRESSION / MDM / ASSESSMENT AND PLAN / ED COURSE  I reviewed the triage vital signs and the nursing notes.                              Differential diagnosis includes, but is not limited to, bronchitis, viral illness, dehydration, electrolyte abnormality, anemia, sepsis, ACS, PE  Patient presenting to the ER for evaluation of symptoms as described above.  Based on symptoms, risk factors and considered above differential, this presenting complaint could reflect a potentially  life-threatening illness therefore the patient will be placed on continuous pulse oximetry and telemetry for monitoring.  Laboratory evaluation will be sent to evaluate for the above complaints.  Patient is exceedingly well-appearing clinically.  Does have leukopenia raises question for recent viral illness.  Her abdominal exam is soft and benign.  She is low risk by Wells criteria and is PERC negative.  EKG nonischemic troponin negative this not consistent with ACS.  Not consistent with Boerhaave's.  Chest x-ray on my review and interpretation without evidence of consolidation or mediastinal widening or pneumothorax.  Will trial nebulizer for possible component of bronchitis that she does have seasonal allergies.   Clinical Course as of 01/16/23 2158  Wed Jan 16, 2023  2155 Patient reassessed with significant improvement after nebulizer treatment.  May have component of mild bronchitis.  Does appear stable and appropriate for outpatient follow-up. [PR]    Clinical Course User Index [PR] Willy Eddy, MD     FINAL CLINICAL IMPRESSION(S) / ED DIAGNOSES   Final diagnoses:  Dyspnea, unspecified type     Rx / DC Orders   ED Discharge Orders          Ordered    albuterol (VENTOLIN HFA) 108 (90 Base) MCG/ACT inhaler  Every 6 hours PRN        01/16/23 2157             Note:  This document was prepared using Dragon voice recognition software and may include unintentional dictation errors.    Willy Eddy, MD 01/16/23 2158

## 2023-01-16 NOTE — ED Triage Notes (Signed)
Pt via POV from home. Pt c/o generalized chest pain, SOB, and nausea and vomiting that started Sunday. States the pain gradually gotten better. Denies any cardiac or respiratory hx. States pain today is 1/10. Pt is A&Ox4 and NAD

## 2024-07-03 ENCOUNTER — Emergency Department
Admission: EM | Admit: 2024-07-03 | Discharge: 2024-07-03 | Disposition: A | Discharge status: Home / Self Care | Attending: Emergency Medicine | Admitting: Emergency Medicine

## 2024-07-03 ENCOUNTER — Encounter: Payer: Self-pay | Admitting: Emergency Medicine

## 2024-07-03 ENCOUNTER — Other Ambulatory Visit: Payer: Self-pay

## 2024-07-03 DIAGNOSIS — Z23 Encounter for immunization: Secondary | ICD-10-CM | POA: Diagnosis not present

## 2024-07-03 DIAGNOSIS — W010XXA Fall on same level from slipping, tripping and stumbling without subsequent striking against object, initial encounter: Secondary | ICD-10-CM | POA: Insufficient documentation

## 2024-07-03 DIAGNOSIS — S81012A Laceration without foreign body, left knee, initial encounter: Secondary | ICD-10-CM | POA: Diagnosis not present

## 2024-07-03 DIAGNOSIS — I10 Essential (primary) hypertension: Secondary | ICD-10-CM | POA: Insufficient documentation

## 2024-07-03 DIAGNOSIS — S8992XA Unspecified injury of left lower leg, initial encounter: Secondary | ICD-10-CM | POA: Diagnosis present

## 2024-07-03 MED ORDER — TETANUS-DIPHTH-ACELL PERTUSSIS 5-2-15.5 LF-MCG/0.5 IM SUSP
0.5000 mL | Freq: Once | INTRAMUSCULAR | Status: AC
  Administered 2024-07-03: 0.5 mL via INTRAMUSCULAR
  Filled 2024-07-03: qty 0.5

## 2024-07-03 MED ORDER — LIDOCAINE-EPINEPHRINE 2 %-1:100000 IJ SOLN
20.0000 mL | Freq: Once | INTRAMUSCULAR | Status: AC
  Administered 2024-07-03: 20 mL via INTRADERMAL
  Filled 2024-07-03: qty 1

## 2024-07-03 NOTE — ED Triage Notes (Signed)
 Patient to ED via [facility] from home for a laceration to the left knee. States she was working in the yard when she tripped and cut her knee. VS WNL. Bleeding controlled.

## 2024-07-03 NOTE — Discharge Instructions (Addendum)
 The laceration on your knee was closed with sutures today.  These are nonabsorbable and will need to be removed in about 14 days.  This can be done by the emergency department, urgent care or your primary care provider.  Please wash the wound with soap and water daily, pat dry, then cover with gauze and secure gauze using the Ace bandage.  I placed you in a knee immobilizer today to prevent you from bending your knee so that this can heal.  It is most important that you wear this knee immobilizer over the next few days as the wound is most likely to reopen in the first few days of healing.  Watch for signs of infection including redness, warmth, swelling, pain and pus drainage.  If you develop any of these please return to the ED, urgent care or your primary care provider.  Your tetanus shot was updated today.  You can take 650 mg of Tylenol  and 600 mg of ibuprofen  every 6 hours as needed for pain. You can use ice, heat, muscle creams and other topical pain relievers as well.

## 2024-07-03 NOTE — ED Provider Notes (Signed)
 [facility] Provider Note    Event Date/Time   First MD Initiated Contact with Patient 07/03/24 1651     (approximate)   History   Laceration   HPI  Christy Kennedy is a 38 y.o. female with PMH of hypertension and anemia who presents for evaluation of a laceration over the left knee.  Patient was working in the yard at home when she fell cutting her knee on a brick or stone.  Patient's tetanus shot is not up-to-date.       Physical Exam   Triage Vital Signs: ED Triage Vitals  Encounter Vitals Group     BP 07/03/24 1540 (!) 182/96     Girls Systolic BP Percentile --      Girls Diastolic BP Percentile --      Boys Systolic BP Percentile --      Boys Diastolic BP Percentile --      Pulse Rate 07/03/24 1540 80     Resp 07/03/24 1540 17     Temp 07/03/24 1540 98.5 F (36.9 C)     Temp Source 07/03/24 1540 Oral     SpO2 07/03/24 1540 97 %     Weight 07/03/24 1535 180 lb (81.6 kg)     Height 07/03/24 1535 5' 6 (1.676 m)     Head Circumference --      Peak Flow --      Pain Score 07/03/24 1535 2     Pain Loc --      Pain Education --      Exclude from Growth Chart --     Most recent vital signs: Vitals:   07/03/24 1540  BP: (!) 182/96  Pulse: 80  Resp: 17  Temp: 98.5 F (36.9 C)  SpO2: 97%   General: Awake, no distress.  CV:  Good peripheral perfusion.  Resp:  Normal effort.  Abd:  No distention.  Other:  Linear laceration over the patella approximately 6 cm long with subcutaneous fat exposed and bleeding controlled, range of motion of the knee well-maintained, dorsalis pedis pulse 2+ and regular   ED Results / Procedures / Treatments   Labs (all labs ordered are listed, but only abnormal results are displayed) Labs Reviewed - No data to display   PROCEDURES:  Critical Care performed: No  .Laceration Repair  Date/Time: 07/03/2024 6:08 PM  Performed by: Cleaster Tinnie LABOR, PA-C Authorized by: Cleaster Tinnie LABOR, PA-C    Consent:    Consent obtained:  Verbal   Consent given by:  Patient   Risks, benefits, and alternatives were discussed: yes     Risks discussed:  Infection, pain, poor cosmetic result, poor wound healing and need for additional repair   Alternatives discussed:  No treatment Universal protocol:    Patient identity confirmed:  Verbally with patient Anesthesia:    Anesthesia method:  Local infiltration   Local anesthetic:  Lidocaine  2% WITH epi Laceration details:    Location:  Leg   Leg location:  L knee   Length (cm):  6   Depth (mm):  5 Pre-procedure details:    Preparation:  Patient was prepped and draped in usual sterile fashion Exploration:    Hemostasis achieved with:  Direct pressure   Wound exploration: wound explored through full range of motion and entire depth of wound visualized     Contaminated: no   Treatment:    Area cleansed with:  Povidone-iodine   Amount of cleaning:  Standard   Irrigation  solution:  Sterile saline   Irrigation method:  Syringe Skin repair:    Repair method:  Sutures   Suture size:  4-0   Suture material:  Prolene   Suture technique:  Horizontal mattress and running   Number of sutures:  4 (3 horizontal mattress, 1 running) Approximation:    Approximation:  Close Repair type:    Repair type:  Intermediate Post-procedure details:    Dressing:  Bulky dressing and splint for protection   Procedure completion:  Tolerated well, no immediate complications    MEDICATIONS ORDERED IN ED: Medications  Tdap (ADACEL) injection 0.5 mL (has no administration in time range)  lidocaine -EPINEPHrine  (XYLOCAINE  W/EPI) 2 %-1:100000 (with pres) injection 20 mL (20 mLs Intradermal Given by Other 07/03/24 1721)     IMPRESSION / MDM / ASSESSMENT AND PLAN / ED COURSE  I reviewed the triage vital signs and the nursing notes.                             38 year old female presents for evaluation of a laceration over the knee, vital signs stable aside from  elevated blood pressure but patient has history of hypertension and is quite anxious upon exam.  Vital signs stable otherwise.  Differential diagnosis includes, but is not limited to, laceration, less likely ligament injury, tendon injury, vascular injury.  Patient's presentation is most consistent with acute, uncomplicated illness.  Physical exam is reassuring and that patient is neurovascularly intact.  She can fully flex and extend the knee.  Very low suspicion for ligament, tendon, nerve or vascular injury.  Laceration is quite long and is gaping open.  Did recommend closure with sutures which patient was agreeable to.  Please see procedure note for further details.  Patient instructed on wound care.  Will place her in a knee immobilizer to prevent her from bending the knee and ripping the stitches.  Did discuss appropriate time for follow-up for suture removal.  Patient's tetanus shot was updated today.  She voiced understanding, all questions were answered and she was stable at discharge.      FINAL CLINICAL IMPRESSION(S) / ED DIAGNOSES   Final diagnoses:  Knee laceration, left, initial encounter     Rx / DC Orders   ED Discharge Orders     None        Note:  This document was prepared using Dragon voice recognition software and may include unintentional dictation errors.   Cleaster Tinnie LABOR, PA-C 07/03/24 1810    Jossie Artist POUR, MD 07/03/24 2351

## 2024-09-16 ENCOUNTER — Other Ambulatory Visit: Payer: Self-pay | Admitting: Internal Medicine

## 2024-09-16 DIAGNOSIS — Z1231 Encounter for screening mammogram for malignant neoplasm of breast: Secondary | ICD-10-CM

## 2024-10-23 ENCOUNTER — Encounter
